# Patient Record
Sex: Female | Born: 1970 | Race: White | Hispanic: No | Marital: Married | Smoking: Current every day smoker
Health system: Southern US, Community
[De-identification: ages and names within clinical notes are randomized; demographics above are authoritative.]

## PROBLEM LIST (undated history)

## (undated) DIAGNOSIS — F32A Depression, unspecified: Secondary | ICD-10-CM

## (undated) DIAGNOSIS — F329 Major depressive disorder, single episode, unspecified: Secondary | ICD-10-CM

## (undated) DIAGNOSIS — R51 Headache: Secondary | ICD-10-CM

## (undated) DIAGNOSIS — F319 Bipolar disorder, unspecified: Secondary | ICD-10-CM

## (undated) DIAGNOSIS — B029 Zoster without complications: Secondary | ICD-10-CM

## (undated) DIAGNOSIS — M545 Low back pain, unspecified: Secondary | ICD-10-CM

## (undated) DIAGNOSIS — N390 Urinary tract infection, site not specified: Secondary | ICD-10-CM

## (undated) DIAGNOSIS — IMO0002 Reserved for concepts with insufficient information to code with codable children: Secondary | ICD-10-CM

## (undated) HISTORY — PX: TONSILECTOMY, ADENOIDECTOMY, BILATERAL MYRINGOTOMY AND TUBES: SHX2538

## (undated) HISTORY — PX: FEMUR FRACTURE SURGERY: SHX633

## (undated) HISTORY — PX: TONSILLECTOMY: SUR1361

## (undated) HISTORY — PX: FOOT FUSION: SHX956

## (undated) HISTORY — PX: CHOLECYSTECTOMY: SHX55

## (undated) HISTORY — PX: TUBAL LIGATION: SHX77

## (undated) HISTORY — PX: WISDOM TOOTH EXTRACTION: SHX21

---

## 1995-03-05 DIAGNOSIS — IMO0002 Reserved for concepts with insufficient information to code with codable children: Secondary | ICD-10-CM

## 1995-03-05 HISTORY — DX: Reserved for concepts with insufficient information to code with codable children: IMO0002

## 1998-09-08 ENCOUNTER — Other Ambulatory Visit: Admission: RE | Admit: 1998-09-08 | Discharge: 1998-09-08 | Payer: Self-pay | Admitting: Obstetrics and Gynecology

## 1999-11-26 ENCOUNTER — Other Ambulatory Visit: Admission: RE | Admit: 1999-11-26 | Discharge: 1999-11-26 | Payer: Self-pay | Admitting: Gynecology

## 2000-01-21 ENCOUNTER — Encounter (INDEPENDENT_AMBULATORY_CARE_PROVIDER_SITE_OTHER): Payer: Self-pay | Admitting: Specialist

## 2000-01-21 ENCOUNTER — Other Ambulatory Visit: Admission: RE | Admit: 2000-01-21 | Discharge: 2000-01-21 | Payer: Self-pay | Admitting: Obstetrics & Gynecology

## 2002-12-07 ENCOUNTER — Encounter: Payer: Self-pay | Admitting: Family Medicine

## 2002-12-07 ENCOUNTER — Ambulatory Visit (HOSPITAL_COMMUNITY): Admission: RE | Admit: 2002-12-07 | Discharge: 2002-12-07 | Payer: Self-pay | Admitting: Family Medicine

## 2002-12-08 ENCOUNTER — Encounter: Payer: Self-pay | Admitting: Family Medicine

## 2002-12-08 ENCOUNTER — Ambulatory Visit (HOSPITAL_COMMUNITY): Admission: RE | Admit: 2002-12-08 | Discharge: 2002-12-08 | Payer: Self-pay | Admitting: Family Medicine

## 2004-03-26 ENCOUNTER — Observation Stay (HOSPITAL_COMMUNITY): Admission: AD | Admit: 2004-03-26 | Discharge: 2004-03-27 | Payer: Self-pay | Admitting: Obstetrics and Gynecology

## 2004-06-25 ENCOUNTER — Ambulatory Visit (HOSPITAL_COMMUNITY): Admission: AD | Admit: 2004-06-25 | Discharge: 2004-06-25 | Payer: Self-pay | Admitting: Obstetrics & Gynecology

## 2004-06-27 ENCOUNTER — Inpatient Hospital Stay (HOSPITAL_COMMUNITY): Admission: RE | Admit: 2004-06-27 | Discharge: 2004-06-29 | Payer: Self-pay | Admitting: Obstetrics & Gynecology

## 2005-01-03 ENCOUNTER — Observation Stay (HOSPITAL_COMMUNITY): Admission: RE | Admit: 2005-01-03 | Discharge: 2005-01-04 | Payer: Self-pay | Admitting: Obstetrics and Gynecology

## 2005-01-03 ENCOUNTER — Encounter: Payer: Self-pay | Admitting: Obstetrics and Gynecology

## 2005-01-21 ENCOUNTER — Ambulatory Visit: Payer: Self-pay | Admitting: Pain Medicine

## 2005-01-30 ENCOUNTER — Ambulatory Visit: Payer: Self-pay | Admitting: Pain Medicine

## 2005-02-04 ENCOUNTER — Ambulatory Visit: Payer: Self-pay | Admitting: Pain Medicine

## 2005-02-21 ENCOUNTER — Ambulatory Visit: Payer: Self-pay | Admitting: Pain Medicine

## 2005-03-06 ENCOUNTER — Ambulatory Visit: Payer: Self-pay | Admitting: Physician Assistant

## 2005-06-24 ENCOUNTER — Ambulatory Visit: Payer: Self-pay | Admitting: Physician Assistant

## 2005-07-08 ENCOUNTER — Ambulatory Visit: Payer: Self-pay | Admitting: Physician Assistant

## 2005-07-22 ENCOUNTER — Ambulatory Visit: Payer: Self-pay | Admitting: Physician Assistant

## 2005-08-15 ENCOUNTER — Ambulatory Visit: Payer: Self-pay | Admitting: Pain Medicine

## 2005-10-28 ENCOUNTER — Ambulatory Visit: Payer: Self-pay | Admitting: Pain Medicine

## 2006-02-20 ENCOUNTER — Ambulatory Visit: Payer: Self-pay | Admitting: Physician Assistant

## 2006-05-30 ENCOUNTER — Ambulatory Visit: Payer: Self-pay | Admitting: Physician Assistant

## 2006-06-05 ENCOUNTER — Ambulatory Visit: Payer: Self-pay | Admitting: Pain Medicine

## 2006-08-26 ENCOUNTER — Ambulatory Visit: Payer: Self-pay | Admitting: Physician Assistant

## 2006-09-07 ENCOUNTER — Emergency Department (HOSPITAL_COMMUNITY): Admission: EM | Admit: 2006-09-07 | Discharge: 2006-09-07 | Payer: Self-pay | Admitting: Emergency Medicine

## 2006-10-09 ENCOUNTER — Ambulatory Visit (HOSPITAL_COMMUNITY): Admission: RE | Admit: 2006-10-09 | Discharge: 2006-10-09 | Payer: Self-pay | Admitting: Family Medicine

## 2006-10-15 ENCOUNTER — Ambulatory Visit (HOSPITAL_COMMUNITY): Admission: RE | Admit: 2006-10-15 | Discharge: 2006-10-15 | Payer: Self-pay | Admitting: Family Medicine

## 2007-08-14 ENCOUNTER — Emergency Department (HOSPITAL_COMMUNITY): Admission: EM | Admit: 2007-08-14 | Discharge: 2007-08-14 | Payer: Self-pay | Admitting: Emergency Medicine

## 2008-07-05 ENCOUNTER — Other Ambulatory Visit: Admission: RE | Admit: 2008-07-05 | Discharge: 2008-07-05 | Payer: Self-pay | Admitting: Obstetrics and Gynecology

## 2008-07-22 ENCOUNTER — Emergency Department (HOSPITAL_COMMUNITY): Admission: EM | Admit: 2008-07-22 | Discharge: 2008-07-22 | Payer: Self-pay | Admitting: Emergency Medicine

## 2008-11-21 ENCOUNTER — Ambulatory Visit (HOSPITAL_COMMUNITY): Admission: RE | Admit: 2008-11-21 | Discharge: 2008-11-21 | Payer: Self-pay | Admitting: Obstetrics and Gynecology

## 2008-12-19 ENCOUNTER — Ambulatory Visit (HOSPITAL_COMMUNITY): Admission: RE | Admit: 2008-12-19 | Discharge: 2008-12-19 | Payer: Self-pay | Admitting: Obstetrics and Gynecology

## 2009-09-06 ENCOUNTER — Emergency Department (HOSPITAL_COMMUNITY): Admission: EM | Admit: 2009-09-06 | Discharge: 2009-09-06 | Payer: Self-pay | Admitting: Emergency Medicine

## 2010-05-20 LAB — BASIC METABOLIC PANEL
BUN: 7 mg/dL (ref 6–23)
CO2: 25 mEq/L (ref 19–32)
CO2: 27 mEq/L (ref 19–32)
Calcium: 9.2 mg/dL (ref 8.4–10.5)
Calcium: 9.6 mg/dL (ref 8.4–10.5)
Creatinine, Ser: 0.9 mg/dL (ref 0.4–1.2)
Creatinine, Ser: 0.94 mg/dL (ref 0.4–1.2)
GFR calc Af Amer: >60 mL/min (ref >60)
GFR calc non Af Amer: >60 mL/min (ref >60)
GFR calc non Af Amer: >60 mL/min (ref >60)
Glucose, Bld: 83 mg/dL (ref 70–99)
Glucose, Bld: 89 mg/dL (ref 70–99)

## 2010-05-20 LAB — URINE CULTURE
Colony Count: NO GROWTH
Culture: NO GROWTH

## 2010-05-20 LAB — DIFFERENTIAL
Basophils Absolute: 0 10*3/uL (ref 0.0–0.1)
Basophils Relative: 0 % (ref 0–1)
Eosinophils Absolute: 0.1 10*3/uL (ref 0.0–0.7)
Monocytes Relative: 4 % (ref 3–12)
Neutrophils Relative %: 76 % (ref 43–77)

## 2010-05-20 LAB — RAPID URINE DRUG SCREEN, HOSP PERFORMED: Barbiturates: NOT DETECTED

## 2010-05-20 LAB — CBC
Hemoglobin: 17.4 g/dL — ABNORMAL HIGH (ref 12.0–15.0)
MCH: 33.4 pg (ref 26.0–34.0)
MCHC: 34.4 g/dL (ref 30.0–36.0)
RDW: 14.2 % (ref 11.5–15.5)

## 2010-05-20 LAB — URINALYSIS, ROUTINE W REFLEX MICROSCOPIC
Glucose, UA: NEGATIVE mg/dL
Hgb urine dipstick: NEGATIVE
Protein, ur: NEGATIVE mg/dL
pH: 7.5 (ref 5.0–8.0)

## 2010-05-20 LAB — URINE MICROSCOPIC-ADD ON

## 2010-05-20 LAB — PREGNANCY, URINE: Preg Test, Ur: NEGATIVE

## 2010-06-07 LAB — HEMOGLOBIN AND HEMATOCRIT, BLOOD: Hemoglobin: 15.6 g/dL — ABNORMAL HIGH (ref 12.0–15.0)

## 2010-07-20 NOTE — Op Note (Signed)
NAMEOLIA, HINDERLITER               ACCOUNT NO.:  [id number]   MEDICAL RECORD NO.:  [id number]          PATIENT TYPE:  OBV   LOCATION:  A336                          FACILITY:  [facility]   PHYSICIAN:  Tilda Burrow, M.D. DATE OF BIRTH:  1970/04/22   DATE OF PROCEDURE:  01/03/2005  DATE OF DISCHARGE:                                 OPERATIVE REPORT   PREOPERATIVE DIAGNOSIS:  Abdominal wall pain secondary to fibrosis from old  wound infection.   POSTOPERATIVE DIAGNOSIS:  Abdominal wall pain secondary to fibrosis from old  wound infection.   PROCEDURE:  Revision of old abdominal wall wound (Pfannenstiel incision).   SURGEON:  Dr. Emelda Fear.   ASSISTANT:  None.   ANESTHESIA:  General.   COMPLICATIONS:  None.   ESTIMATED BLOOD LOSS:  25 cc.   SPECIMENS:  Skin to lab.   DETAILS OF PROCEDURE:  The patient was taken to the operating room and  prepped and draped for low abdominal surgery. A Pfannenstiel type incision  had been marked out with plans to excise the old fibrotic Pfannenstiel  incision with adjacent surrounding tissue. Ellipse of skin to be removed was  approximately 10 cm in width and would reach from anterior to superior iliac  crest on each side to the contralateral side at the same location. The  ellipse of skin was marked first with Bovie cautery through the skin and  then dissected down. Inferior margins were dissected loose first. We were  careful to remove the thick, fibrotic, retracted center portion of the old  Pfannenstiel incision which was densely adherent down to the fascia. This  was sharply excised off of the underlying connective tissue. The upper  margin was then cut and then the skin and underlying connective tissue  excised. We were careful to leave a layer of fatty tissue overlying the  fascia. The skin specimen was passed off. At this point, as previously  discussed with [facility] preoperatively, opened the old Pfannenstiel incision  in the fascia in a  transverse fashion over the middle two thirds of the  incision, undermining the skin cephalad and caudal. We excised an  approximately 2 cm strip of the fascia which included the most fibrotic area  of the prior incision and the part that had been attached to the overlying  tissue. The muscle underneath it was mobilized free from the fascia such  that the fascia could then be pulled together with continuous running 0  Vicryl with good approximation. There was a potential hernia defect  encountered just lateral to the rectus muscles on the right side where a  window developed in the internal oblique tissue. This was pulled together  with a series of interrupted 2-0 plain suture.   Once the fascia had been pulled together, we wanted to mobilize the skin and  subcutaneous tissue to give optimal cosmetic result. The subcutaneous tissue  was split approximately 2 cm beneath the skin, for a depth of approximately  2 cm to allow for increased mobility of the skin and fatty tissue. A series  of interrupted 2-0 plain sutures were then  placed throughout the  subcutaneous tissue to reapproximate the connective tissue and pull the skin  edges closer together without tension. Approximately 15 separate interrupted  2-0 plain stitches were used to pull tissue edges together. Electrocautery  was used as necessary to contain areas of oozing. The procedure was actually  quite dry; 25 mL estimated blood loss. At this time, skin edges could be  pulled together without significant tension. The head was elevated slightly  to make this easier, and then staple closure of the skin performed. Prior to  the stapling, we placed a flat JP drain beneath the skin, and after placing  the staples, we used Marcaine solution to inject just lateral to the  incision on either side to improve pain control postoperatively; 10 mL of  Marcaine solution was infiltrated in the subcu tissue.      Tilda Burrow, M.D.   Electronically Signed     JVF/MEDQ  D:  01/03/2005  T:  01/04/2005  Job:  [id number]   cc:   [facility]

## 2010-07-20 NOTE — Op Note (Signed)
NAME:  [name], [name] NO.:  [id number]   MEDICAL RECORD NO.:  [id number]          PATIENT TYPE:  INP   LOCATION:  A411                          FACILITY:  [facility]   PHYSICIAN:  Lazaro Arms, M.D.   DATE OF BIRTH:  April 20, 1970   DATE OF PROCEDURE:  06/27/2004  DATE OF DISCHARGE:                                 OPERATIVE REPORT   PREOPERATIVE DIAGNOSES:  1.  Intrauterine pregnancy at 40 weeks' gestation.  2.  Previous cesarean section.  3.  Desired sterilization.   POSTOPERATIVE DIAGNOSES:  1.  Intrauterine pregnancy at 40 weeks' gestation.  2.  Previous cesarean section.  3.  Desired sterilization.  4.  Back-up transverse lie.   PROCEDURE:  Repeat cesarean section with bilateral tubal ligation.   SURGEON:  Lazaro Arms, M.D.   ANESTHESIA:  Spinal.   FINDINGS:  Over a low transverse hysterotomy incision was delivered a viable  female infant with Apgars of 8 and 9, the weight to be determined in the  nursery.  The placenta was anterior.  It had to be delivered before the  baby.  The baby was a back-up transverse lie.  We used a vacuum extractor on  the fetal vertex to effect delivery.  The uterus, tubes, and ovaries were  normal.  The placenta was normal as well.  Cord blood and cord gas were  sent.   DESCRIPTION OF OPERATION:  The patient was taken to the operating room and  placed in the sitting position and underwent a spinal anesthetic, placed in  the supine position with a bump under the right hip.  She was prepped and  draped in the usual sterile fashion.  A Pfannenstiel skin incision was made  and carried down sharply to the rectus fascia, which was scored in the  midline and extended laterally.  The fascia was taken off the muscles  superiorly and inferiorly without difficulty.  The muscles were divided and  the peritoneal cavity was entered.  A bladder blade was placed and the  vesicouterine serosal flap was created.  A low transverse hysterotomy  incision was made.  Over this incision was delivered a viable female infant  with Apgars of 8 and 9 with a weight to be determined in the nursery.  There  was a three-vessel cord.  Cord blood and cord gas were sent.  The placenta  was sent to pathology since it was anterior and delivered before the baby.  The infant was delivered with vacuum extraction of the fetal vertex.  It was  back-up transverse lie.  The infant was handed to Dr. Milford Cage, who was in  attendance, for routine neonatal resuscitation.  The uterus was  exteriorized, closed in two layers, the first being a running interlocking  layer, the second being an imbricating layer.  There was good hemostasis.  The peritoneal cavity was irrigated vigorously.  A modified Pomeroy  bilateral tubal ligation was performed bilaterally at patient request.  There was good hemostasis of both pedicles.  The muscles were reapproximated  loosely, the fascia closed using 0  Vicryl running, the subcutaneous tissue  was made hemostatic and irrigated.  The skin was closed using skin staples.  The patient tolerated the procedure  well.  She experienced 750 mL of blood loss and was taken to recovery in  good, stable condition.  All counts were correct x3.      LHE/MEDQ  D:  06/27/2004  T:  06/27/2004  Job:  [id number]

## 2010-07-20 NOTE — Discharge Summary (Signed)
[name], [name]               ACCOUNT NO.:  [id number]   MEDICAL RECORD NO.:  [id number]          PATIENT TYPE:  INP   LOCATION:  A411                          FACILITY:  [facility]   PHYSICIAN:  Lazaro Arms, M.D.   DATE OF BIRTH:  Mar 16, 1970   DATE OF ADMISSION:  06/27/2004  DATE OF DISCHARGE:  04/28/2006LH                                 DISCHARGE SUMMARY   DISCHARGE DIAGNOSES:  1.  Status post a repeat cesarean section with bilateral tubal ligation.  2.  Unremarkable postoperative course.   Please refer to the history and physical as well as the antepartum chart for  details of admission to the hospital.   HOSPITAL COURSE:  Patient was admitted after undergoing a repeat cesarean  section and also bilateral tubal ligation at her request.  The patient's  intraoperative course was unremarkable.  Please refer to the operative note  for details.  Postoperatively the patient did very well.  Her preoperative  hemoglobin and hematocrit were 12.2 and 34.3 and postoperative day #1 was  10.6, 29.6.  Her white count preoperative was 11.4 and postoperative 13.9.  She remained afebrile throughout the hospital course.  Her dressing was  removed.  Her incision was clean, dry, intact.  There was no cellulitis or  evidence of hematoma.  As a result she was discharged to home on the morning  of postoperative day #2 in good stable condition to follow up in the office  the following week to have her staples removed and Steri-Strips placed.  She  was given instructions and precautions for return prior to that time.      LHE/MEDQ  D:  07/25/2004  T:  07/25/2004  Job:  [id number]

## 2010-07-20 NOTE — H&P (Signed)
[name], [name]               ACCOUNT NO.:  [id number]   MEDICAL RECORD NO.:  [id number]          PATIENT TYPE:  OBV   LOCATION:  A416                          FACILITY:  [facility]   PHYSICIAN:  Tilda Burrow, M.D. DATE OF BIRTH:  06-Feb-1971   DATE OF ADMISSION:  03/26/2004  DATE OF DISCHARGE:  LH                                HISTORY & PHYSICAL   HISTORY OF PRESENT ILLNESS:  [name] is a gravida 7, para 3 at 26 weeks and 5  days with unresolved fever and cough after five days of treatment with  Augmentin 875 b.i.d. and cough medication.  She presents to the office today  with decreased lung sounds of her right lobe and bronchial wheezing.   PAST MEDICAL HISTORY:  Negative.   PAST SURGICAL HISTORY:  Positive for a D&C and a C-section.   ALLERGIES:  She has no known allergies.   Her prenatal course has been uneventful up until this time.  Blood type is  O+.  UDS is negative.  Rubella is immune.  Hepatitis B surface antigen is  negative.  HIV is negative. rpr  is nonreactive.  Pap is normal.  GC and  Chlamydia are negative.  AFB is within normal limits.   PHYSICAL EXAMINATION:  Vital signs:  Weight is 147.  Blood pressure 130/70.  Fetal heart rates are 160, strong and regular.  Lungs:  She has decreased  upper right lobe airway sounds and some bronchial wheezing.   PLAN:  We are going to admit for IV antibiotics, shielded chest x-ray, and  respiratory treatments and expect discharge in a.m.      DL/MEDQ  D:  16/12/9602  T:  03/26/2004  Job:  [id number]

## 2010-07-20 NOTE — H&P (Signed)
Pamela, [name]               ACCOUNT NO.:  [id number]   MEDICAL RECORD NO.:  [id number]          PATIENT TYPE:  AMB   LOCATION:  DAY                           FACILITY:  [facility]   PHYSICIAN:  Tilda Burrow, M.D. DATE OF BIRTH:  1970/11/16   DATE OF ADMISSION:  01/03/2005  DATE OF DISCHARGE:  LH                                HISTORY & PHYSICAL   ADMISSION DIAGNOSES:  1.  Chronic incisional pain.  2.  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  3.  Status post cesarean section.   HISTORY OF PRESENT ILLNESS:  This 40 year old female is admitted for  abdominal wall incision revision.  [name] is now 6 months status post a  repeat cesarean section and tubal ligation performed June 29, 2004, which  was complicated by postoperative wound infection which required delayed  secondary healing.  Unfortunately, it has resulted in debilitating pain in  the incision area.  This appears to be very superficial in nature.  She  describes it as a constant tingling and pain across the incision like pins  and needles sticking her.  She avoids clothing that contacts the area.  She  has difficulty wearing a seatbelt and must restrict activities with her  children to avoid contact with the incision.  She cannot sleep on her  stomach and sitting in place puts pressure on the incision.  Due to the  these multiple complaints which have remained stable over the past several  months, we are planning to excise the old cicatrix down to the level of the  fascia and explore the fascia itself to attempt to release the fibrosis and  to sever any entrapped sensory nerves in the fascia.  The patient  understands that in order to address the possibility of an entrapped nerve  in the fascia that we will need to actually open the fascia and excise some  of the old fibrosis in that area as well.  This will prolong healing.  The  patient is aware that there is some potential for recurrent wound infection  with an incision on the body.  She acknowledges this risk.   PAST MEDICAL HISTORY:  Benign.  The patient specifically has no history of  chronic pain syndrome and seems to be a reasonable, level-headed individual  with no overlying psychologic limitations.  Medical history otherwise  negative.   PAST SURGICAL HISTORY:  1.  D&C x2.  2.  C-section x2.  3.  Tonsillectomy and tubes in the ears as a child.   SOCIAL HISTORY:  Smokes 20 cigarettes per day.  Alcohol and recreational  drugs denied.   MEDICATIONS:  Aleve, Tylenol PM, vitamins and Lorcet plus 7.5/650 every 6  hours as needed for pain at the incision.   PHYSICAL EXAMINATION:  GENERAL:  A slim, Caucasian female.  VITAL SIGN:  Height 5 feet 4 inches (estimate), weight 129, blood pressure  118/62.  HEENT:  Pupils equal round and reactive to light.  Extraocular movements  intact.  NECK:  Supple.  CARDIAC:  Unremarkable.  ABDOMEN:  Lax lower abdominal skin except for  dense fibrosis in the area of  the old Pfannenstiel incision.  The skin is very sensitive to even light  touch in the area around the incision with hypersensitive paresthesia.  There is no fluid fluctuation.  There is no suspicious of infection at this  time.   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   PLAN:  Wide excision of old cicatrix with opening of the fascia.  Nicoderm  patch while in hospital.  Overnight observation, end in a.m.      Tilda Burrow, M.D.  Electronically Signed     JVF/MEDQ  D:  01/03/2005  T:  01/03/2005  Job:  [id number]

## 2010-07-20 NOTE — Op Note (Signed)
[name], [name]               ACCOUNT NO.:  [id number]   MEDICAL RECORD NO.:  [id number]          PATIENT TYPE:  OBV   LOCATION:  A336                          FACILITY:  [facility]   PHYSICIAN:  Tilda Burrow, M.D. DATE OF BIRTH:  Mar 17, 1970   DATE OF PROCEDURE:  01/03/2005  DATE OF DISCHARGE:                                 OPERATIVE REPORT   PREOPERATIVE DIAGNOSIS:  Postoperative incisional bleeding.   POSTOPERATIVE DIAGNOSIS:  Postoperative incisional bleeding, superficial.   PROCEDURE:  Exploration of incision.   SURGEON:  Tilda Burrow, M.D.   ASSISTANTAmie Critchley.   ANESTHESIA:  General.   COMPLICATIONS:  None.   ESTIMATED BLOOD LOSS:  50 cc intraoperative.   FINDINGS:  Capillary bleeder in the skin on the left half of the incision.  Ecchymosis in the skin from the old Marcaine injections done at the first  surgery leading to increased suspicion of subcu bleeding.   DETAILS OF PROCEDURE:  The patient was taken to the operating room and  prepped and draped after hemoglobin was found to have dropped from 14 to 12.  The patient continued to actively ooze from a spot in the left of the  midline, one third of the distance from the left end of the incision. This  was bright red bleeding and active and steady. The ecchymosis raised the  question of whether it was a deep capillary bleeder or skin bleeder. Since  it failed to respond to pressure dressing, we proceeded with prepping and  draping, removed the staples for approximately 10 cm around the area of  suspicion. We removed the JP drain earlier prior to prepping. The small  capillary could be identified in the subcu tissue just beneath the skin. The  skin edges had inverted slightly since the surgery, and there was less  pressure on the capillary then likely to have been there earlier. Point  electrocautery was used to achieve hemostasis. The deep parts of the  incision were explored and found to be hemostatic.  There was no dark blood  or clot. The 10-cm portion of the incision was explored sufficiently to  convince me that the fascia was intact, that there was no major bleeding  deep to the incision and the blood was only at the superficial site. I did  find one small clot close to the midline representative of a small capillary  that had bleed some but had stopped. Point electrocautery was used at this  area as well. We replaced the JP drain and allowed it to exit through a  separate stab wound at the left anterior aspect of the mons pubis. Staple  closure of the skin completed the procedure after subcutaneous 2-0 plain  was used to obliterate potential space. The patient tolerated the procedure  well and went to the recovery room in good condition. Intraoperative blood  including _incision exploration and irrigation__ is estimated at 50 mL. As  stated earlier, hemoglobin had dropped from 14 to 12.      Tilda Burrow, M.D.  Electronically Signed     JVF/MEDQ  D:  01/03/2005  T:  01/04/2005  Job:  [id number]

## 2010-08-22 ENCOUNTER — Emergency Department (HOSPITAL_COMMUNITY): Payer: Medicaid Other

## 2010-08-22 ENCOUNTER — Inpatient Hospital Stay (HOSPITAL_COMMUNITY)
Admission: EM | Admit: 2010-08-22 | Discharge: 2010-08-28 | DRG: 956 | Disposition: A | Discharge status: Home Health | Payer: Medicaid Other | Source: Ambulatory Visit | Attending: Surgery | Admitting: Surgery

## 2010-08-22 DIAGNOSIS — D62 Acute posthemorrhagic anemia: Secondary | ICD-10-CM | POA: Diagnosis not present

## 2010-08-22 DIAGNOSIS — F319 Bipolar disorder, unspecified: Secondary | ICD-10-CM | POA: Diagnosis present

## 2010-08-22 DIAGNOSIS — S72309A Unspecified fracture of shaft of unspecified femur, initial encounter for closed fracture: Principal | ICD-10-CM | POA: Diagnosis present

## 2010-08-22 DIAGNOSIS — F17200 Nicotine dependence, unspecified, uncomplicated: Secondary | ICD-10-CM | POA: Diagnosis present

## 2010-08-22 DIAGNOSIS — F1010 Alcohol abuse, uncomplicated: Secondary | ICD-10-CM | POA: Diagnosis present

## 2010-08-22 DIAGNOSIS — S3600XA Unspecified injury of spleen, initial encounter: Secondary | ICD-10-CM | POA: Diagnosis present

## 2010-08-22 DIAGNOSIS — S92309A Fracture of unspecified metatarsal bone(s), unspecified foot, initial encounter for closed fracture: Secondary | ICD-10-CM | POA: Diagnosis present

## 2010-08-22 LAB — COMPREHENSIVE METABOLIC PANEL
AST: 53 U/L — ABNORMAL HIGH (ref 0–37)
Alkaline Phosphatase: 88 U/L (ref 39–117)
BUN: 11 mg/dL (ref 6–23)
CO2: 21 mEq/L (ref 19–32)
Chloride: 102 mEq/L (ref 96–112)
Creatinine, Ser: 0.76 mg/dL (ref 0.50–1.10)
GFR calc non Af Amer: >60 mL/min (ref >60)
Total Bilirubin: 0.2 mg/dL — ABNORMAL LOW (ref 0.3–1.2)

## 2010-08-22 LAB — URINE MICROSCOPIC-ADD ON

## 2010-08-22 LAB — CBC
HCT: 36.4 % (ref 36.0–46.0)
HCT: 39.3 % (ref 36.0–46.0)
Hemoglobin: 12.4 g/dL (ref 12.0–15.0)
MCHC: 34.1 g/dL (ref 30.0–36.0)
MCHC: 35.9 g/dL (ref 30.0–36.0)
MCV: 88.3 fL (ref 78.0–100.0)
RBC: 4.11 MIL/uL (ref 3.87–5.11)
RDW: 12.5 % (ref 11.5–15.5)

## 2010-08-22 LAB — HEMOGLOBIN AND HEMATOCRIT, BLOOD
HCT: 33.5 % — ABNORMAL LOW (ref 36.0–46.0)
HCT: 32.8 % — ABNORMAL LOW (ref 36.0–46.0)
Hemoglobin: 11.6 g/dL — ABNORMAL LOW (ref 12.0–15.0)
Hemoglobin: 11.5 g/dL — ABNORMAL LOW (ref 12.0–15.0)

## 2010-08-22 LAB — BASIC METABOLIC PANEL
BUN: 8 mg/dL (ref 6–23)
CO2: 22 mEq/L (ref 19–32)
Chloride: 107 mEq/L (ref 96–112)
Glucose, Bld: 153 mg/dL — ABNORMAL HIGH (ref 70–99)
Potassium: 3.8 mEq/L (ref 3.5–5.1)
Sodium: 139 mEq/L (ref 135–145)

## 2010-08-22 LAB — POCT I-STAT, CHEM 8
BUN: 10 mg/dL (ref 6–23)
Calcium, Ion: 1.04 mmol/L — ABNORMAL LOW (ref 1.12–1.32)
Chloride: 108 mEq/L (ref 96–112)
Glucose, Bld: 91 mg/dL (ref 70–99)

## 2010-08-22 LAB — URINALYSIS, ROUTINE W REFLEX MICROSCOPIC
Bilirubin Urine: NEGATIVE
Glucose, UA: NEGATIVE mg/dL
Ketones, ur: NEGATIVE mg/dL
Leukocytes, UA: NEGATIVE
Protein, ur: 30 mg/dL — AB

## 2010-08-22 LAB — ETHANOL: Alcohol, Ethyl (B): 131 mg/dL — ABNORMAL HIGH (ref 0–11)

## 2010-08-22 MED ORDER — IOHEXOL 300 MG/ML  SOLN
100.0000 mL | Freq: Once | INTRAMUSCULAR | Status: AC | PRN
  Administered 2010-08-22: 100 mL via INTRAVENOUS

## 2010-08-23 LAB — URINE CULTURE

## 2010-08-23 LAB — COMPREHENSIVE METABOLIC PANEL
ALT: 172 U/L — ABNORMAL HIGH (ref 0–35)
AST: 117 U/L — ABNORMAL HIGH (ref 0–37)
Albumin: 3 g/dL — ABNORMAL LOW (ref 3.5–5.2)
CO2: 28 mEq/L (ref 19–32)
Calcium: 8.5 mg/dL (ref 8.4–10.5)
Chloride: 103 mEq/L (ref 96–112)
Creatinine, Ser: 0.65 mg/dL (ref 0.50–1.10)
GFR calc non Af Amer: >60 mL/min (ref >60)
Sodium: 138 mEq/L (ref 135–145)
Total Bilirubin: 0.6 mg/dL (ref 0.3–1.2)

## 2010-08-23 LAB — CBC
HCT: 30.7 % — ABNORMAL LOW (ref 36.0–46.0)
Hemoglobin: 10.6 g/dL — ABNORMAL LOW (ref 12.0–15.0)
WBC: 9.9 10*3/uL (ref 4.0–10.5)

## 2010-08-23 LAB — HEMOGLOBIN AND HEMATOCRIT, BLOOD
HCT: 31.3 % — ABNORMAL LOW (ref 36.0–46.0)
HCT: 30.1 % — ABNORMAL LOW (ref 36.0–46.0)
Hemoglobin: 10.8 g/dL — ABNORMAL LOW (ref 12.0–15.0)

## 2010-08-23 LAB — PROTIME-INR
INR: 1.02 (ref 0.00–1.49)
Prothrombin Time: 13.6 seconds (ref 11.6–15.2)

## 2010-08-24 LAB — BASIC METABOLIC PANEL
BUN: 4 mg/dL — ABNORMAL LOW (ref 6–23)
Chloride: 105 mEq/L (ref 96–112)
Creatinine, Ser: 0.58 mg/dL (ref 0.50–1.10)
Glucose, Bld: 105 mg/dL — ABNORMAL HIGH (ref 70–99)
Potassium: 3.7 mEq/L (ref 3.5–5.1)

## 2010-08-24 LAB — CBC
HCT: 28.9 % — ABNORMAL LOW (ref 36.0–46.0)
Hemoglobin: 10.2 g/dL — ABNORMAL LOW (ref 12.0–15.0)
MCV: 90 fL (ref 78.0–100.0)
RDW: 12.6 % (ref 11.5–15.5)
WBC: 12.2 10*3/uL — ABNORMAL HIGH (ref 4.0–10.5)

## 2010-08-25 LAB — CBC
HCT: 27.3 % — ABNORMAL LOW (ref 36.0–46.0)
Platelets: 140 10*3/uL — ABNORMAL LOW (ref 150–400)
RDW: 12.5 % (ref 11.5–15.5)
WBC: 10.8 10*3/uL — ABNORMAL HIGH (ref 4.0–10.5)

## 2010-08-28 LAB — CBC
Hemoglobin: 9.4 g/dL — ABNORMAL LOW (ref 12.0–15.0)
MCH: 30.4 pg (ref 26.0–34.0)
Platelets: 236 10*3/uL (ref 150–400)
RBC: 3.09 MIL/uL — ABNORMAL LOW (ref 3.87–5.11)
WBC: 10.6 10*3/uL — ABNORMAL HIGH (ref 4.0–10.5)

## 2010-08-29 ENCOUNTER — Ambulatory Visit (HOSPITAL_BASED_OUTPATIENT_CLINIC_OR_DEPARTMENT_OTHER)
Admission: RE | Admit: 2010-08-29 | Discharge: 2010-08-30 | Disposition: A | Discharge status: Home / Self Care | Payer: No Typology Code available for payment source | Source: Ambulatory Visit | Attending: Orthopedic Surgery | Admitting: Orthopedic Surgery

## 2010-08-29 DIAGNOSIS — IMO0002 Reserved for concepts with insufficient information to code with codable children: Secondary | ICD-10-CM | POA: Insufficient documentation

## 2010-08-29 DIAGNOSIS — S92209A Fracture of unspecified tarsal bone(s) of unspecified foot, initial encounter for closed fracture: Secondary | ICD-10-CM | POA: Insufficient documentation

## 2010-08-29 DIAGNOSIS — M12579 Traumatic arthropathy, unspecified ankle and foot: Secondary | ICD-10-CM | POA: Insufficient documentation

## 2010-08-29 DIAGNOSIS — S92309A Fracture of unspecified metatarsal bone(s), unspecified foot, initial encounter for closed fracture: Secondary | ICD-10-CM | POA: Insufficient documentation

## 2010-08-29 LAB — POCT HEMOGLOBIN-HEMACUE: Hemoglobin: 10.1 g/dL — ABNORMAL LOW (ref 12.0–15.0)
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
## 2010-11-16 ENCOUNTER — Ambulatory Visit: Payer: Medicaid Other | Attending: Orthopedic Surgery | Admitting: Physical Therapy

## 2010-11-16 DIAGNOSIS — IMO0001 Reserved for inherently not codable concepts without codable children: Secondary | ICD-10-CM | POA: Insufficient documentation

## 2010-11-16 DIAGNOSIS — M25669 Stiffness of unspecified knee, not elsewhere classified: Secondary | ICD-10-CM | POA: Insufficient documentation

## 2010-11-16 DIAGNOSIS — M6281 Muscle weakness (generalized): Secondary | ICD-10-CM | POA: Insufficient documentation

## 2010-11-16 DIAGNOSIS — R262 Difficulty in walking, not elsewhere classified: Secondary | ICD-10-CM | POA: Insufficient documentation

## 2010-11-22 ENCOUNTER — Ambulatory Visit: Payer: Medicaid Other

## 2010-11-28 ENCOUNTER — Encounter: Payer: No Typology Code available for payment source | Admitting: Physical Therapy

## 2010-11-30 ENCOUNTER — Encounter: Payer: No Typology Code available for payment source | Admitting: Physical Therapy

## 2010-12-04 ENCOUNTER — Ambulatory Visit: Payer: Medicaid Other | Attending: Orthopedic Surgery | Admitting: Physical Therapy

## 2010-12-04 DIAGNOSIS — M25669 Stiffness of unspecified knee, not elsewhere classified: Secondary | ICD-10-CM | POA: Insufficient documentation

## 2010-12-04 DIAGNOSIS — M6281 Muscle weakness (generalized): Secondary | ICD-10-CM | POA: Insufficient documentation

## 2010-12-04 DIAGNOSIS — R262 Difficulty in walking, not elsewhere classified: Secondary | ICD-10-CM | POA: Insufficient documentation

## 2010-12-04 DIAGNOSIS — IMO0001 Reserved for inherently not codable concepts without codable children: Secondary | ICD-10-CM | POA: Insufficient documentation

## 2010-12-07 ENCOUNTER — Ambulatory Visit: Payer: Medicaid Other | Admitting: Physical Therapy

## 2010-12-09 ENCOUNTER — Emergency Department (HOSPITAL_COMMUNITY)
Admission: EM | Admit: 2010-12-09 | Discharge: 2010-12-09 | Disposition: A | Discharge status: Home / Self Care | Payer: Medicaid Other | Attending: Emergency Medicine | Admitting: Emergency Medicine

## 2010-12-09 ENCOUNTER — Encounter: Payer: Self-pay | Admitting: *Deleted

## 2010-12-09 DIAGNOSIS — B029 Zoster without complications: Secondary | ICD-10-CM | POA: Insufficient documentation

## 2010-12-09 DIAGNOSIS — H53149 Visual discomfort, unspecified: Secondary | ICD-10-CM | POA: Insufficient documentation

## 2010-12-09 DIAGNOSIS — H5710 Ocular pain, unspecified eye: Secondary | ICD-10-CM | POA: Insufficient documentation

## 2010-12-09 DIAGNOSIS — F17200 Nicotine dependence, unspecified, uncomplicated: Secondary | ICD-10-CM | POA: Insufficient documentation

## 2010-12-09 MED ORDER — HYDROCODONE-ACETAMINOPHEN 5-325 MG PO TABS
1.0000 | ORAL_TABLET | Freq: Once | ORAL | Status: AC
  Administered 2010-12-09: 1 via ORAL
  Filled 2010-12-09: qty 1

## 2010-12-09 MED ORDER — ACYCLOVIR 200 MG PO CAPS
800.0000 mg | ORAL_CAPSULE | Freq: Once | ORAL | Status: AC
  Administered 2010-12-09: 800 mg via ORAL
  Filled 2010-12-09: qty 4

## 2010-12-09 MED ORDER — DOXYCYCLINE HYCLATE 100 MG PO TABS
100.0000 mg | ORAL_TABLET | Freq: Once | ORAL | Status: AC
  Administered 2010-12-09: 100 mg via ORAL
  Filled 2010-12-09: qty 1

## 2010-12-09 MED ORDER — DOXYCYCLINE HYCLATE 100 MG PO CAPS: 100.0000 mg | ORAL_CAPSULE | Freq: Two times a day (BID) | ORAL | Status: AC

## 2010-12-09 MED ORDER — ACYCLOVIR 800 MG PO TABS: 800.0000 mg | ORAL_TABLET | Freq: Every day | ORAL | Status: AC

## 2010-12-09 MED ORDER — ACYCLOVIR 200 MG PO CAPS
ORAL_CAPSULE | ORAL | Status: AC
  Filled 2010-12-09: qty 4

## 2010-12-09 NOTE — ED Notes (Signed)
Pt states that she started to notice "knots" to her right side of forehead and right side of head, above right eye also, pt c/o swelling to right eye, also c/o weakness since the :"\knots" started to appear, denies any injury,

## 2010-12-09 NOTE — ED Provider Notes (Signed)
History     CSN: [id number] Arrival date & time: 12/09/2010  2:28 PM  Chief Complaint  Patient presents with  . Cyst    (Consider location/radiation/quality/duration/timing/severity/associated sxs/prior treatment) HPI Comments: Pt states she has had these skin lesions "cysts" pop up on her face in the past 3-4 days.  No fever or chills.  No trauma.  No h/o being immunocompromised  The history is provided by the patient. No language interpreter was used.    History reviewed. No pertinent past medical history.  Past Surgical History  Procedure Date  . Foot fusion   . Femur fracture surgery   . Cesarean section   . Tubal ligation   . Cholecystectomy   . Tonsillectomy   . Tonsilectomy, adenoidectomy, bilateral myringotomy and tubes     History reviewed. No pertinent family history.  History  Substance Use Topics  . Smoking status: Current Everyday Smoker  . Smokeless tobacco: Not on file  . Alcohol Use:     OB History    Grav Para Term Preterm Abortions TAB SAB Ect Mult Living                  Review of Systems  Eyes: Positive for photophobia, pain and redness.  All other systems reviewed and are negative.    Allergies  Review of patient's allergies indicates no known allergies.  Home Medications  No current outpatient prescriptions on file.  BP 128/75  Pulse 84  Temp(Src) 98.8 F (37.1 C) (Oral)  Resp 20  Ht 5\' 2"  (1.575 m)  Wt 135 lb (61.236 kg)  BMI 24.69 kg/m2  SpO2 99%  Physical Exam  Nursing note and vitals reviewed. Constitutional: She is oriented to person, place, and time. Vital signs are normal. She appears well-developed and well-nourished. No distress.  HENT:  Head: Normocephalic and atraumatic.    Right Ear: External ear normal.  Left Ear: External ear normal.  Nose: Nose normal.  Mouth/Throat: No oropharyngeal exudate.  Eyes: Conjunctivae and EOM are normal. Pupils are equal, round, and reactive to light. Right eye exhibits no  discharge. Left eye exhibits no discharge. No scleral icterus.  Neck: Normal range of motion. Neck supple. No JVD present. No tracheal deviation present. No thyromegaly present.  Cardiovascular: Normal rate, regular rhythm, normal heart sounds, intact distal pulses and normal pulses.  Exam reveals no gallop and no friction rub.   No murmur heard. Pulmonary/Chest: Effort normal and breath sounds normal. No stridor. No respiratory distress. She has no wheezes. She has no rales. She exhibits no tenderness.  Abdominal: Soft. Normal appearance and bowel sounds are normal. She exhibits no distension and no mass. There is no tenderness. There is no rebound and no guarding.  Musculoskeletal: Normal range of motion. She exhibits no edema and no tenderness.  Lymphadenopathy:    She has no cervical adenopathy.  Neurological: She is alert and oriented to person, place, and time. She has normal reflexes. Coordination normal. GCS eye subscore is 4. GCS verbal subscore is 5. GCS motor subscore is 6.  Skin: Skin is warm and dry. No rash noted. She is not diaphoretic.  Psychiatric: She has a normal mood and affect. Her speech is normal and behavior is normal. Judgment and thought content normal. Cognition and memory are normal.    ED Course  Procedures (including critical care time)  Labs Reviewed - No data to display No results found.   No diagnosis found.    MDM  4:08 PM i spoke  with dr. Sudie Bailey.  He will see pt in his office tomorrow or the following day.  4:00 PM- i spoke with dr. zammit who then came and evaluated the facial lesions.  We decided to treat for both bacterial and shingles lesions.      Worthy Rancher, PA 12/09/10 1612  Worthy Rancher, PA 12/09/10 [phone]

## 2010-12-09 NOTE — ED Notes (Signed)
Pt has ?abrasion to top of forehead and red area in between her eyes on her nose, pt denies any injury,

## 2010-12-09 NOTE — ED Provider Notes (Signed)
Medical screening examination/treatment/procedure(s) were conducted as a shared visit with non-physician practitioner(s) and myself.  I personally evaluated the patient during the encounter Pt complained of a rash.  pe  Rash to forehead and right side of nose.  Benny Lennert, MD 12/09/10 2237

## 2010-12-11 ENCOUNTER — Encounter: Payer: No Typology Code available for payment source | Admitting: Physical Therapy

## 2010-12-12 ENCOUNTER — Encounter: Payer: No Typology Code available for payment source | Admitting: Physical Therapy

## 2010-12-18 ENCOUNTER — Encounter: Payer: No Typology Code available for payment source | Admitting: Physical Therapy

## 2010-12-20 ENCOUNTER — Encounter: Payer: No Typology Code available for payment source | Admitting: Physical Therapy

## 2011-01-07 ENCOUNTER — Other Ambulatory Visit: Payer: Self-pay | Admitting: Orthopedic Surgery

## 2011-01-07 ENCOUNTER — Encounter (HOSPITAL_COMMUNITY): Payer: Self-pay

## 2011-01-07 DIAGNOSIS — IMO0002 Reserved for concepts with insufficient information to code with codable children: Secondary | ICD-10-CM

## 2011-01-08 ENCOUNTER — Encounter (HOSPITAL_COMMUNITY): Payer: Self-pay

## 2011-01-08 ENCOUNTER — Other Ambulatory Visit: Payer: Self-pay

## 2011-01-08 ENCOUNTER — Encounter (HOSPITAL_COMMUNITY)
Admission: RE | Admit: 2011-01-08 | Discharge: 2011-01-08 | Disposition: A | Discharge status: Home / Self Care | Payer: Medicaid Other | Source: Ambulatory Visit | Attending: Orthopedic Surgery | Admitting: Orthopedic Surgery

## 2011-01-08 HISTORY — DX: Major depressive disorder, single episode, unspecified: F32.9

## 2011-01-08 HISTORY — DX: Reserved for concepts with insufficient information to code with codable children: IMO0002

## 2011-01-08 HISTORY — DX: Headache: R51

## 2011-01-08 HISTORY — DX: Low back pain: M54.5

## 2011-01-08 HISTORY — DX: Bipolar disorder, unspecified: F31.9

## 2011-01-08 HISTORY — DX: Low back pain, unspecified: M54.50

## 2011-01-08 HISTORY — DX: Zoster without complications: B02.9

## 2011-01-08 HISTORY — DX: Depression, unspecified: F32.A

## 2011-01-08 LAB — COMPREHENSIVE METABOLIC PANEL
Albumin: 4 g/dL (ref 3.5–5.2)
BUN: 7 mg/dL (ref 6–23)
Calcium: 10.4 mg/dL (ref 8.4–10.5)
Creatinine, Ser: 0.7 mg/dL (ref 0.50–1.10)
GFR calc Af Amer: >90 mL/min (ref >90)
Glucose, Bld: 83 mg/dL (ref 70–99)
Total Protein: 6.9 g/dL (ref 6.0–8.3)

## 2011-01-08 LAB — CBC
HCT: 38.3 % (ref 36.0–46.0)
Hemoglobin: 13.5 g/dL (ref 12.0–15.0)
MCH: 30.5 pg (ref 26.0–34.0)
MCHC: 35.2 g/dL (ref 30.0–36.0)
MCV: 86.5 fL (ref 78.0–100.0)
RDW: 13.2 % (ref 11.5–15.5)

## 2011-01-08 LAB — URINALYSIS, ROUTINE W REFLEX MICROSCOPIC
Bilirubin Urine: NEGATIVE
Hgb urine dipstick: NEGATIVE
Ketones, ur: NEGATIVE mg/dL
Specific Gravity, Urine: 1.007 (ref 1.005–1.030)
pH: 6 (ref 5.0–8.0)

## 2011-01-08 LAB — DIFFERENTIAL
Eosinophils Absolute: 0.3 10*3/uL (ref 0.0–0.7)
Lymphs Abs: 4 10*3/uL (ref 0.7–4.0)
Monocytes Relative: 3 % (ref 3–12)
Neutro Abs: 8.9 10*3/uL — ABNORMAL HIGH (ref 1.7–7.7)
Neutrophils Relative %: 65 % (ref 43–77)

## 2011-01-08 LAB — PROTIME-INR
INR: 0.92 (ref 0.00–1.49)
Prothrombin Time: 12.6 seconds (ref 11.6–15.2)

## 2011-01-08 LAB — SURGICAL PCR SCREEN: Staphylococcus aureus: NEGATIVE

## 2011-01-08 LAB — URINE MICROSCOPIC-ADD ON

## 2011-01-08 LAB — APTT: aPTT: 33 seconds (ref 24–37)

## 2011-01-08 NOTE — Pre-Procedure Instructions (Signed)
[name]  01/08/2011   Your procedure is scheduled on:  Friday January 11, 2011  Report to [facility] at 530 AM.  Call this number if you have problems the morning of surgery: [phone]   Remember:   Do not eat food:After Midnight.  Do not drink clear liquids: 4 Hours before arrival (1:30am).  Take these medicines the morning of surgery with A SIP OF WATER:  Hydrocodone, lamotrigine(lamictal)   Do not wear jewelry, make-up or nail polish.  Do not wear lotions, powders, or perfumes. You may wear deodorant.  Do not shave 48 hours prior to surgery.  Do not bring valuables to the hospital.  Contacts, dentures or bridgework may not be worn into surgery.  Leave suitcase in the car. After surgery it may be brought to your room.  For patients admitted to the hospital, checkout time is 11:00 AM the day of discharge.   Patients discharged the day of surgery will not be allowed to drive home.  Name and phone number of your driver: Renold Genta [phone]  Special Instructions: CHG Shower Use Special Wash: 1/2 bottle night before surgery and 1/2 bottle morning of surgery.   Please read over the following fact sheets that you were given: Pain Booklet, Coughing and Deep Breathing, MRSA Information and Surgical Site Infection Prevention

## 2011-01-08 NOTE — Progress Notes (Signed)
Contacted Dr. Magdalene Patricia office, spoke with [facility] requested orders for PAT/surgery, states Dr. Magdalene Patricia PA, Mellody Dance would be entering orders.

## 2011-01-09 LAB — URINE CULTURE
Culture  Setup Time: 201211062101
Culture: NO GROWTH

## 2011-01-09 NOTE — H&P (Addendum)
[name] is an 40 y.o. female.   Chief Complaint: R thigh pain/R femur nonunion HPI: Ms. [name] is a 40 y/o female with h/o R femur fracture and R foot fxs in June of 2012. She was initially treated by Dr. Lestine Box with IMN of her R femur.  Since surgery she has been having issues with respect to pain and has continued to smoke despite continued instructions to cease.  Pt followed up with OTS regarding a R femoral shaft nonunion.  Pt now presents to [facility] for [facility] and exchange nailing of R femoral shaft nonunion Pt reports persistent R thigh pain and difficulty ambulating.  Pt did have acute fracture of HW in the beginning of October as well.  We did inform patient at that time that we would not operate until she could prove she could quit smoking.  We had this conversation 2 weeks ago and states she has stopped smoking.  We will obtain nicotine test to verify.  Again it was explained at length that proceeding with surgery and continued smoking would put the patient at unnecessary risk.  We wanted to optimize her healing potential before proceeding with surgery and patient was in complete agreement   Past Medical History  Diagnosis Date  . Ulcer 1997    treated and resolved  . Headache     occassional migraines  . Low back pain   . Depression   . Bipolar 1 disorder, depressed     takes lamictal  . Shingles     has had in pass month, completed treatment week of 01/02/2011, on forehead and near eye    Past Surgical History  Procedure Date  . Foot fusion   . Femur fracture surgery   . Cesarean section     x2  . Tubal ligation   . Cholecystectomy   . Tonsillectomy   . Tonsilectomy, adenoidectomy, bilateral myringotomy and tubes   . Wisdom tooth extraction     No family history on file.  Social History:  reports that she has been smoking Cigarettes.  She has smoked for the past 28 years. She does not have any smokeless tobacco history on file. She reports that she drinks alcohol. She  reports that she uses illicit drugs (Marijuana).  Allergies: No Known Allergies  No current facility-administered medications on file as of .   No current outpatient prescriptions on file as of .    Results for orders placed during the hospital encounter of 01/08/11 (from the past 48 hour(s))  HCG, SERUM, QUALITATIVE     Status: Normal   Collection Time   01/08/11  1:32 PM      Component Value Range Comment   Preg, Serum NEGATIVE  NEGATIVE     Dg Chest 2 View  01/08/2011  *RADIOLOGY REPORT*  Clinical Data: Preoperative respiratory examination for right femur hardware removal.  CHEST - 2 VIEW  Comparison: Chest CT 08/22/2010.  Chest radiographs 08/22/2010 and 03/26/2004.  Findings: The heart size and mediastinal contours are stable.  The lungs appear clear.  There is slightly increased density at the confluence of the right sixth and ninth ribs on the frontal examination.  This is probably due to overlap of osseous and vascular structures or possibly a healing fracture.  There was no evidence of nodule in this area on the CT performed 4 months ago. No acute osseous findings are identified.  IMPRESSION: No acute cardiopulmonary process.  Original Report Authenticated By: Gerrianne Scale, M.D.  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   HENT: Negative.   Eyes: Negative.   Respiratory: Negative.   Cardiovascular: Negative.   Gastrointestinal: Negative.   Genitourinary: Negative.   Musculoskeletal:       + Right thigh pain  Skin: Negative.   Neurological: Negative.   Psychiatric/Behavioral: Positive for depression.    There were no vitals taken for this visit. Office V/S: Afebrile with stable VS  Physical Exam  Constitutional: She is oriented to person, place, and time. Vital signs are normal. She appears well-developed and well-nourished.  HENT:  Head: Normocephalic and atraumatic.    Eyes: EOM are normal.  Neck: Normal range of motion and full passive range of motion without pain. Neck supple. No spinous process tenderness and no muscular tenderness present.  Cardiovascular: Normal rate, regular rhythm, S1 normal, S2 normal and normal heart sounds.   Pulses:      Dorsalis pedis pulses are 2+ on the right side, and 2+ on the left side.  Respiratory: Effort normal and breath sounds normal.  GI: Soft. Bowel sounds are normal. There is no tenderness.  Musculoskeletal:       Right hip: She exhibits decreased strength. She exhibits no tenderness.       Right knee: She exhibits normal range of motion, no effusion, no deformity, normal alignment, no LCL laxity and no bony tenderness. no tenderness found. No medial joint line, no lateral joint line, no MCL and no LCL tenderness noted.       Legs:      R Hip ROM is limited secondary to pain in R thigh Good ROM at R Knee, particularly full extension noted, no effusion and no instability are noted  Clinical alignment of B lower extremities appears appropriate  Neurological: She is alert and oriented to person, place, and time. No sensory deficit.       Decreased strength R hip and knee secondary to pain at R thigh  Skin: Skin is warm, dry and intact.       Surgical wounds are healed without erythema or other signs of infection  Psychiatric: She has a normal mood and affect. Her behavior is normal. Judgment and thought content normal. Cognition and memory are normal.     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 R Foot injury  S/p ORIF by Dr, Lestine Box 3. Depression 4. Nicotine Dependence  Discussed nicotine use at length with patient.  She understands that with continued nicotine use the likelihood  of her going on to complete union are drastically diminished 5. FEN  Will complete metabolic bone workup as an inpatient, but again confident that biology deficiency due to nicotine use 6. Dispo  OR on 01/11/2011 for [facility] and exchange nailing of right femur  Mearl Latin [phone]) 01/09/2011, 9:57 AM I discussed with the patient the risks and benefits of surgery, including the possibility of infection, nerve injury, vessel injury, wound breakdown, arthritis, symptomatic hardware, DVT/ PE, loss of motion, persistent nonunion, and need for further surgery among others. She understood these risks and wished to proceed.

## 2011-01-10 MED ORDER — CEFAZOLIN SODIUM 1-5 GM-% IV SOLN
1.0000 g | INTRAVENOUS | Status: DC
  Filled 2011-01-10: qty 50

## 2011-01-10 NOTE — Progress Notes (Signed)
Labs reviewed today  U/A and micro were reviewed which showed evidence of Trichomoniasis Pt was called in Flagyl 500 mg po q12h x 7 days  Mearl Latin, PA-C 12/10/2010 1733

## 2011-01-11 ENCOUNTER — Inpatient Hospital Stay (HOSPITAL_COMMUNITY): Payer: Medicaid Other

## 2011-01-11 ENCOUNTER — Encounter (HOSPITAL_COMMUNITY)
Admission: RE | Disposition: A | Discharge status: Home / Self Care | Payer: Self-pay | Source: Ambulatory Visit | Attending: Orthopedic Surgery

## 2011-01-11 ENCOUNTER — Inpatient Hospital Stay (HOSPITAL_COMMUNITY)
Admission: RE | Admit: 2011-01-11 | Discharge: 2011-01-14 | DRG: 481 | Disposition: A | Discharge status: Home / Self Care | Payer: Medicaid Other | Source: Ambulatory Visit | Attending: Orthopedic Surgery | Admitting: Orthopedic Surgery

## 2011-01-11 ENCOUNTER — Inpatient Hospital Stay (HOSPITAL_COMMUNITY): Payer: Medicaid Other | Admitting: Vascular Surgery

## 2011-01-11 ENCOUNTER — Other Ambulatory Visit: Payer: Self-pay | Admitting: Orthopedic Surgery

## 2011-01-11 ENCOUNTER — Encounter (HOSPITAL_COMMUNITY): Payer: Self-pay | Admitting: *Deleted

## 2011-01-11 ENCOUNTER — Encounter (HOSPITAL_COMMUNITY): Payer: Self-pay | Admitting: Anesthesiology

## 2011-01-11 DIAGNOSIS — Z8711 Personal history of peptic ulcer disease: Secondary | ICD-10-CM

## 2011-01-11 DIAGNOSIS — F3130 Bipolar disorder, current episode depressed, mild or moderate severity, unspecified: Secondary | ICD-10-CM | POA: Diagnosis present

## 2011-01-11 DIAGNOSIS — G43909 Migraine, unspecified, not intractable, without status migrainosus: Secondary | ICD-10-CM | POA: Diagnosis present

## 2011-01-11 DIAGNOSIS — IMO0002 Reserved for concepts with insufficient information to code with codable children: Principal | ICD-10-CM | POA: Diagnosis present

## 2011-01-11 DIAGNOSIS — Y831 Surgical operation with implant of artificial internal device as the cause of abnormal reaction of the patient, or of later complication, without mention of misadventure at the time of the procedure: Secondary | ICD-10-CM | POA: Diagnosis present

## 2011-01-11 DIAGNOSIS — D62 Acute posthemorrhagic anemia: Secondary | ICD-10-CM | POA: Diagnosis not present

## 2011-01-11 DIAGNOSIS — M545 Low back pain, unspecified: Secondary | ICD-10-CM | POA: Insufficient documentation

## 2011-01-11 DIAGNOSIS — Z01812 Encounter for preprocedural laboratory examination: Secondary | ICD-10-CM

## 2011-01-11 DIAGNOSIS — Z87891 Personal history of nicotine dependence: Secondary | ICD-10-CM

## 2011-01-11 DIAGNOSIS — A5900 Urogenital trichomoniasis, unspecified: Secondary | ICD-10-CM | POA: Diagnosis present

## 2011-01-11 DIAGNOSIS — R519 Headache, unspecified: Secondary | ICD-10-CM | POA: Insufficient documentation

## 2011-01-11 DIAGNOSIS — S7290XK Unspecified fracture of unspecified femur, subsequent encounter for closed fracture with nonunion: Secondary | ICD-10-CM

## 2011-01-11 DIAGNOSIS — R51 Headache: Secondary | ICD-10-CM | POA: Insufficient documentation

## 2011-01-11 DIAGNOSIS — A599 Trichomoniasis, unspecified: Secondary | ICD-10-CM | POA: Diagnosis present

## 2011-01-11 DIAGNOSIS — T84498A Other mechanical complication of other internal orthopedic devices, implants and grafts, initial encounter: Secondary | ICD-10-CM | POA: Diagnosis present

## 2011-01-11 DIAGNOSIS — F329 Major depressive disorder, single episode, unspecified: Secondary | ICD-10-CM | POA: Insufficient documentation

## 2011-01-11 DIAGNOSIS — F319 Bipolar disorder, unspecified: Secondary | ICD-10-CM | POA: Insufficient documentation

## 2011-01-11 DIAGNOSIS — F1210 Cannabis abuse, uncomplicated: Secondary | ICD-10-CM | POA: Diagnosis present

## 2011-01-11 DIAGNOSIS — Z01818 Encounter for other preprocedural examination: Secondary | ICD-10-CM

## 2011-01-11 HISTORY — DX: Urinary tract infection, site not specified: N39.0

## 2011-01-11 HISTORY — PX: FEMUR IM NAIL: SHX1597

## 2011-01-11 LAB — C-REACTIVE PROTEIN: CRP: 0.75 mg/dL — ABNORMAL HIGH (ref <0.60)

## 2011-01-11 SURGERY — INSERTION, INTRAMEDULLARY ROD, FEMUR
Anesthesia: General | Laterality: Right

## 2011-01-11 MED ORDER — NEOSTIGMINE METHYLSULFATE 1 MG/ML IJ SOLN
INTRAMUSCULAR | Status: DC | PRN
  Administered 2011-01-11: 4 mg via INTRAVENOUS

## 2011-01-11 MED ORDER — OXYCODONE HCL 5 MG PO TABS
5.0000 mg | ORAL_TABLET | ORAL | Status: DC | PRN
  Administered 2011-01-12 – 2011-01-14 (×7): 10 mg via ORAL
  Filled 2011-01-11 (×7): qty 2

## 2011-01-11 MED ORDER — VITAMIN D-3 125 MCG (5000 UT) PO TABS: 1.0000 | ORAL_TABLET | Freq: Every day | ORAL | Status: DC

## 2011-01-11 MED ORDER — HYDROMORPHONE HCL PF 1 MG/ML IJ SOLN
0.5000 mg | INTRAMUSCULAR | Status: DC | PRN
  Administered 2011-01-11 (×4): 0.5 mg via INTRAVENOUS

## 2011-01-11 MED ORDER — INFLUENZA VAC TYP A&B SURF ANT IM INJ
0.5000 mL | INJECTION | INTRAMUSCULAR | Status: AC
  Administered 2011-01-12: 0.5 mL via INTRAMUSCULAR
  Filled 2011-01-11: qty 0.5

## 2011-01-11 MED ORDER — POTASSIUM CHLORIDE IN NACL 20-0.9 MEQ/L-% IV SOLN
INTRAVENOUS | Status: DC
  Administered 2011-01-11 – 2011-01-13 (×3): via INTRAVENOUS
  Filled 2011-01-11 (×8): qty 1000

## 2011-01-11 MED ORDER — METHOCARBAMOL 100 MG/ML IJ SOLN: 1000.0000 mg | Freq: Four times a day (QID) | INTRAVENOUS | Status: DC | PRN

## 2011-01-11 MED ORDER — HETASTARCH-ELECTROLYTES 6 % IV SOLN
INTRAVENOUS | Status: DC | PRN
  Administered 2011-01-11 (×2): via INTRAVENOUS

## 2011-01-11 MED ORDER — METHOCARBAMOL 100 MG/ML IJ SOLN
500.0000 mg | Freq: Four times a day (QID) | INTRAVENOUS | Status: DC | PRN
  Filled 2011-01-11: qty 5

## 2011-01-11 MED ORDER — SODIUM CHLORIDE 0.9 % IR SOLN
Status: DC | PRN
  Administered 2011-01-11: 1000 mL

## 2011-01-11 MED ORDER — METRONIDAZOLE 500 MG PO TABS
500.0000 mg | ORAL_TABLET | Freq: Two times a day (BID) | ORAL | Status: DC
  Administered 2011-01-11 – 2011-01-14 (×6): 500 mg via ORAL
  Filled 2011-01-11 (×7): qty 1

## 2011-01-11 MED ORDER — ONDANSETRON HCL 4 MG/2ML IJ SOLN
INTRAMUSCULAR | Status: DC | PRN
  Administered 2011-01-11: 4 mg via INTRAVENOUS

## 2011-01-11 MED ORDER — DIPHENHYDRAMINE HCL 12.5 MG/5ML PO ELIX
12.5000 mg | ORAL_SOLUTION | Freq: Four times a day (QID) | ORAL | Status: DC | PRN
  Filled 2011-01-11: qty 5

## 2011-01-11 MED ORDER — SODIUM CHLORIDE 0.9 % IJ SOLN: 9.0000 mL | INTRAMUSCULAR | Status: DC | PRN

## 2011-01-11 MED ORDER — LACTATED RINGERS IV SOLN: INTRAVENOUS | Status: DC

## 2011-01-11 MED ORDER — HYDROXYZINE HCL 50 MG PO TABS
25.0000 mg | ORAL_TABLET | Freq: Four times a day (QID) | ORAL | Status: DC | PRN
  Filled 2011-01-11: qty 1

## 2011-01-11 MED ORDER — METHOCARBAMOL 500 MG PO TABS
500.0000 mg | ORAL_TABLET | Freq: Four times a day (QID) | ORAL | Status: DC | PRN
  Administered 2011-01-12 – 2011-01-14 (×5): 1000 mg via ORAL
  Filled 2011-01-11 (×5): qty 2

## 2011-01-11 MED ORDER — HYDROMORPHONE HCL PF 1 MG/ML IJ SOLN
INTRAMUSCULAR | Status: AC
  Filled 2011-01-11: qty 1

## 2011-01-11 MED ORDER — LAMOTRIGINE 100 MG PO TABS
100.0000 mg | ORAL_TABLET | Freq: Every day | ORAL | Status: DC
  Administered 2011-01-12 – 2011-01-14 (×3): 100 mg via ORAL
  Filled 2011-01-11 (×4): qty 1

## 2011-01-11 MED ORDER — GLYCOPYRROLATE 0.2 MG/ML IJ SOLN
INTRAMUSCULAR | Status: DC | PRN
  Administered 2011-01-11: 0.2 mg via INTRAVENOUS
  Administered 2011-01-11: .8 mg via INTRAVENOUS

## 2011-01-11 MED ORDER — ENOXAPARIN SODIUM 40 MG/0.4ML ~~LOC~~ SOLN
40.0000 mg | SUBCUTANEOUS | Status: DC
  Administered 2011-01-12 – 2011-01-13 (×2): 40 mg via SUBCUTANEOUS
  Filled 2011-01-11 (×4): qty 0.4

## 2011-01-11 MED ORDER — METOCLOPRAMIDE HCL 5 MG/ML IJ SOLN
5.0000 mg | Freq: Three times a day (TID) | INTRAMUSCULAR | Status: DC | PRN
  Filled 2011-01-11: qty 2

## 2011-01-11 MED ORDER — PROPOFOL 10 MG/ML IV EMUL
INTRAVENOUS | Status: DC | PRN
  Administered 2011-01-11: 200 mg via INTRAVENOUS

## 2011-01-11 MED ORDER — MEPERIDINE HCL 25 MG/ML IJ SOLN: 6.2500 mg | INTRAMUSCULAR | Status: DC | PRN

## 2011-01-11 MED ORDER — VITAMIN D3 25 MCG (1000 UNIT) PO TABS
5000.0000 [IU] | ORAL_TABLET | Freq: Every day | ORAL | Status: DC
  Administered 2011-01-11 – 2011-01-14 (×4): 5000 [IU] via ORAL
  Filled 2011-01-11 (×4): qty 5

## 2011-01-11 MED ORDER — HYDROMORPHONE HCL PF 1 MG/ML IJ SOLN: 0.5000 mg | INTRAMUSCULAR | Status: DC | PRN

## 2011-01-11 MED ORDER — METOCLOPRAMIDE HCL 10 MG PO TABS: 5.0000 mg | ORAL_TABLET | Freq: Three times a day (TID) | ORAL | Status: DC | PRN

## 2011-01-11 MED ORDER — ONDANSETRON HCL 4 MG/2ML IJ SOLN: 4.0000 mg | Freq: Once | INTRAMUSCULAR | Status: DC | PRN

## 2011-01-11 MED ORDER — ROCURONIUM BROMIDE 100 MG/10ML IV SOLN
INTRAVENOUS | Status: DC | PRN
  Administered 2011-01-11: 50 mg via INTRAVENOUS

## 2011-01-11 MED ORDER — NALOXONE HCL 0.4 MG/ML IJ SOLN: 0.4000 mg | INTRAMUSCULAR | Status: DC | PRN

## 2011-01-11 MED ORDER — DIPHENHYDRAMINE HCL 50 MG/ML IJ SOLN: 12.5000 mg | Freq: Four times a day (QID) | INTRAMUSCULAR | Status: DC | PRN

## 2011-01-11 MED ORDER — LACTATED RINGERS IV SOLN
INTRAVENOUS | Status: DC | PRN
  Administered 2011-01-11 (×3): via INTRAVENOUS

## 2011-01-11 MED ORDER — CALCIUM CITRATE 950 (200 CA) MG PO TABS
200.0000 mg | ORAL_TABLET | Freq: Every day | ORAL | Status: DC
  Administered 2011-01-11 – 2011-01-14 (×4): 200 mg via ORAL
  Filled 2011-01-11 (×4): qty 1

## 2011-01-11 MED ORDER — VITAMIN C 500 MG PO TABS
500.0000 mg | ORAL_TABLET | Freq: Every day | ORAL | Status: DC
  Administered 2011-01-11 – 2011-01-14 (×4): 500 mg via ORAL
  Filled 2011-01-11 (×4): qty 1

## 2011-01-11 MED ORDER — SENNOSIDES-DOCUSATE SODIUM 8.6-50 MG PO TABS: 1.0000 | ORAL_TABLET | Freq: Every evening | ORAL | Status: DC | PRN

## 2011-01-11 MED ORDER — HYDROMORPHONE 0.3 MG/ML IV SOLN
INTRAVENOUS | Status: DC
  Administered 2011-01-11: 0.6 mg via INTRAVENOUS
  Administered 2011-01-11: 14:00:00 via INTRAVENOUS
  Administered 2011-01-11: 4.2 mL via INTRAVENOUS
  Administered 2011-01-12: 1.2 mL via INTRAVENOUS
  Administered 2011-01-12: 5.1 mL via INTRAVENOUS
  Administered 2011-01-12: 3.3 mg via INTRAVENOUS
  Filled 2011-01-11 (×2): qty 25

## 2011-01-11 MED ORDER — MORPHINE SULFATE 2 MG/ML IJ SOLN: 0.0500 mg/kg | INTRAMUSCULAR | Status: DC | PRN

## 2011-01-11 MED ORDER — POLYETHYLENE GLYCOL 3350 17 G PO PACK
17.0000 g | PACK | Freq: Every day | ORAL | Status: DC
  Administered 2011-01-11 – 2011-01-14 (×3): 17 g via ORAL
  Filled 2011-01-11 (×4): qty 1

## 2011-01-11 MED ORDER — METHOCARBAMOL 100 MG/ML IJ SOLN
500.0000 mg | Freq: Once | INTRAVENOUS | Status: AC
  Administered 2011-01-11: 500 mg via INTRAVENOUS
  Filled 2011-01-11: qty 5

## 2011-01-11 MED ORDER — FENTANYL CITRATE 0.05 MG/ML IJ SOLN
INTRAMUSCULAR | Status: DC | PRN
  Administered 2011-01-11: 50 ug via INTRAVENOUS
  Administered 2011-01-11: 100 ug via INTRAVENOUS
  Administered 2011-01-11 (×6): 50 ug via INTRAVENOUS
  Administered 2011-01-11: 100 ug via INTRAVENOUS
  Administered 2011-01-11 (×2): 50 ug via INTRAVENOUS
  Administered 2011-01-11: 100 ug via INTRAVENOUS

## 2011-01-11 MED ORDER — METHOCARBAMOL 500 MG PO TABS: 500.0000 mg | ORAL_TABLET | Freq: Four times a day (QID) | ORAL | Status: DC | PRN

## 2011-01-11 MED ORDER — MIDAZOLAM HCL 5 MG/5ML IJ SOLN
INTRAMUSCULAR | Status: DC | PRN
  Administered 2011-01-11: 2 mg via INTRAVENOUS

## 2011-01-11 MED ORDER — VECURONIUM BROMIDE 10 MG IV SOLR
INTRAVENOUS | Status: DC | PRN
  Administered 2011-01-11: 1 mg via INTRAVENOUS
  Administered 2011-01-11: 2 mg via INTRAVENOUS
  Administered 2011-01-11: 3 mg via INTRAVENOUS

## 2011-01-11 MED ORDER — PHENYLEPHRINE HCL 10 MG/ML IJ SOLN
INTRAMUSCULAR | Status: DC | PRN
  Administered 2011-01-11: 40 ug via INTRAVENOUS
  Administered 2011-01-11: 80 ug via INTRAVENOUS
  Administered 2011-01-11: 40 ug via INTRAVENOUS

## 2011-01-11 MED ORDER — HYDROMORPHONE HCL PF 1 MG/ML IJ SOLN
0.2500 mg | INTRAMUSCULAR | Status: AC | PRN
  Administered 2011-01-11 (×8): 0.5 mg via INTRAVENOUS

## 2011-01-11 MED ORDER — DIPHENHYDRAMINE HCL 12.5 MG/5ML PO ELIX
12.5000 mg | ORAL_SOLUTION | ORAL | Status: DC | PRN
  Filled 2011-01-11: qty 10

## 2011-01-11 MED ORDER — CEFAZOLIN SODIUM 1-5 GM-% IV SOLN
INTRAVENOUS | Status: DC | PRN
  Administered 2011-01-11: 1 g via INTRAVENOUS

## 2011-01-11 MED ORDER — ONDANSETRON HCL 4 MG PO TABS
4.0000 mg | ORAL_TABLET | Freq: Four times a day (QID) | ORAL | Status: DC | PRN
  Administered 2011-01-13: 4 mg via ORAL
  Filled 2011-01-11 (×2): qty 1

## 2011-01-11 MED ORDER — HYDROMORPHONE HCL PF 1 MG/ML IJ SOLN: 0.5000 mg | INTRAMUSCULAR | Status: AC

## 2011-01-11 MED ORDER — CEFAZOLIN SODIUM 1-5 GM-% IV SOLN
1.0000 g | Freq: Four times a day (QID) | INTRAVENOUS | Status: AC
  Administered 2011-01-11 – 2011-01-12 (×3): 1 g via INTRAVENOUS
  Filled 2011-01-11 (×3): qty 50

## 2011-01-11 MED ORDER — DOCUSATE SODIUM 100 MG PO CAPS
100.0000 mg | ORAL_CAPSULE | Freq: Two times a day (BID) | ORAL | Status: DC
Start: 2011-01-11 — End: 2011-01-14
  Administered 2011-01-11 – 2011-01-14 (×6): 100 mg via ORAL
  Filled 2011-01-11 (×7): qty 1

## 2011-01-11 MED ORDER — KETOROLAC TROMETHAMINE 30 MG/ML IJ SOLN
INTRAMUSCULAR | Status: AC
  Administered 2011-01-11: 30 mg via INTRAVENOUS
  Filled 2011-01-11: qty 1

## 2011-01-11 MED ORDER — ONDANSETRON HCL 4 MG/2ML IJ SOLN: 4.0000 mg | Freq: Four times a day (QID) | INTRAMUSCULAR | Status: DC | PRN

## 2011-01-11 SURGICAL SUPPLY — 54 items
BANDAGE ELASTIC 6 VELCRO ST LF (GAUZE/BANDAGES/DRESSINGS) ×2 IMPLANT
BIT DRILL LONG 4.0MM (BIT) ×2 IMPLANT
BIT DRILL SHORT 4.0 (BIT) ×2 IMPLANT
BNDG COHESIVE 6X5 TAN STRL LF (GAUZE/BANDAGES/DRESSINGS) ×2 IMPLANT
BRUSH SCRUB DISP (MISCELLANEOUS) ×4 IMPLANT
CLOTH BEACON ORANGE TIMEOUT ST (SAFETY) ×2 IMPLANT
COVER SURGICAL LIGHT HANDLE (MISCELLANEOUS) ×2 IMPLANT
DRAPE ORTHO SPLIT 77X108 STRL (DRAPES) ×2
DRAPE PROXIMA HALF (DRAPES) ×4 IMPLANT
DRAPE STERI IOBAN 125X83 (DRAPES) IMPLANT
DRAPE SURG ORHT 6 SPLT 77X108 (DRAPES) ×2 IMPLANT
DRAPE U-SHAPE 47X51 STRL (DRAPES) IMPLANT
DRILL BIT LONG 4.0MM (BIT) ×4
DRILL BIT SHORT 4.0 (BIT) ×2
DRILL STEP  6.4 (BIT) ×2 IMPLANT
DRSG EMULSION OIL 3X3 NADH (GAUZE/BANDAGES/DRESSINGS) IMPLANT
DRSG MEPILEX BORDER 4X4 (GAUZE/BANDAGES/DRESSINGS) IMPLANT
DRSG MEPILEX BORDER 4X8 (GAUZE/BANDAGES/DRESSINGS) IMPLANT
ELECT REM PT RETURN 9FT ADLT (ELECTROSURGICAL) ×2
ELECTRODE REM PT RTRN 9FT ADLT (ELECTROSURGICAL) ×1 IMPLANT
GLOVE BIO SURGEON STRL SZ7.5 (GLOVE) ×2 IMPLANT
GLOVE BIO SURGEON STRL SZ8 (GLOVE) ×2 IMPLANT
GLOVE BIOGEL PI IND STRL 7.5 (GLOVE) ×1 IMPLANT
GLOVE BIOGEL PI IND STRL 8 (GLOVE) ×1 IMPLANT
GLOVE BIOGEL PI INDICATOR 7.5 (GLOVE) ×1
GLOVE BIOGEL PI INDICATOR 8 (GLOVE) ×1
GOWN PREVENTION PLUS XLARGE (GOWN DISPOSABLE) ×2 IMPLANT
GOWN STRL NON-REIN LRG LVL3 (GOWN DISPOSABLE) ×4 IMPLANT
GUIDE ROD 3.0 (MISCELLANEOUS) ×2
KIT BASIN OR (CUSTOM PROCEDURE TRAY) ×2 IMPLANT
KIT ROOM TURNOVER OR (KITS) ×2 IMPLANT
MANIFOLD NEPTUNE II (INSTRUMENTS) ×2 IMPLANT
NAIL FEMORAL 11.5X34RT (Nail) ×2 IMPLANT
NS IRRIG 1000ML POUR BTL (IV SOLUTION) ×2 IMPLANT
PACK GENERAL/GYN (CUSTOM PROCEDURE TRAY) ×2 IMPLANT
PAD ARMBOARD 7.5X6 YLW CONV (MISCELLANEOUS) ×2 IMPLANT
REAMER ROD DEEP FLUTE 2.5X950 (INSTRUMENTS) ×4 IMPLANT
ROD GUIDE 3.0 (MISCELLANEOUS) ×1 IMPLANT
SCREW 6.4X75 (Screw) ×2 IMPLANT
SCREW 6.4X85 (Screw) ×2 IMPLANT
SCREW LOCKING 5MM X 35MM (Screw) ×2 IMPLANT
SCREW LOCKING 5MM X 40MM (Screw) ×2 IMPLANT
STAPLER VISISTAT 35W (STAPLE) ×2 IMPLANT
STOCKINETTE IMPERVIOUS LG (DRAPES) ×2 IMPLANT
SUT ETHILON 3 0 PS 1 (SUTURE) ×2 IMPLANT
SUT VIC AB 0 CT1 27 (SUTURE)
SUT VIC AB 0 CT1 27XBRD ANBCTR (SUTURE) IMPLANT
SUT VIC AB 1 CT1 27 (SUTURE)
SUT VIC AB 1 CT1 27XBRD ANBCTR (SUTURE) IMPLANT
SUT VIC AB 2-0 CT1 27 (SUTURE) ×1
SUT VIC AB 2-0 CT1 TAPERPNT 27 (SUTURE) ×1 IMPLANT
TOWEL OR 17X24 6PK STRL BLUE (TOWEL DISPOSABLE) ×2 IMPLANT
TOWEL OR 17X26 10 PK STRL BLUE (TOWEL DISPOSABLE) ×4 IMPLANT
WATER STERILE IRR 1000ML POUR (IV SOLUTION) IMPLANT

## 2011-01-11 NOTE — Anesthesia Preprocedure Evaluation (Addendum)
Anesthesia Evaluation  Patient identified by MRN, date of birth, ID band Patient awake    Reviewed: Allergy & Precautions, H&P , NPO status , Patient's Chart, lab work & pertinent test results, reviewed documented beta blocker date and time   Airway       Dental   Pulmonary Current Smoker (quit 3 wks ago, 1.5 packs/day prior to 3 weeks ago),          Cardiovascular neg cardio ROS     Neuro/Psych  Headaches, PSYCHIATRIC DISORDERS Depression Bipolar Disorder    GI/Hepatic PUD (hx in 1997, pt states possibly gall bladder),   Endo/Other    Renal/GU      Musculoskeletal   Abdominal   Peds  Hematology   Anesthesia Other Findings   Reproductive/Obstetrics                           Anesthesia Physical Anesthesia Plan  ASA: II  Anesthesia Plan: General   Post-op Pain Management:    Induction: Intravenous  Airway Management Planned: Oral ETT  Additional Equipment:   Intra-op Plan:   Post-operative Plan: Extubation in OR  Informed Consent: I have reviewed the patients History and Physical, chart, labs and discussed the procedure including the risks, benefits and alternatives for the proposed anesthesia with the patient or authorized representative who has indicated his/her understanding and acceptance.   Dental advisory given  Plan Discussed with: CRNA and Surgeon  Anesthesia Plan Comments:         Anesthesia Quick Evaluation

## 2011-01-11 NOTE — Transfer of Care (Signed)
Immediate Anesthesia Transfer of Care Note  Patient: [name]  Procedure(s) Performed:  INTRAMEDULLARY (IM) NAIL FEMORAL - Right IM Nail Exchange Right Femoral Shaft   Patient Location: PACU  Anesthesia Type: General  Level of Consciousness: awake and alert   Airway & Oxygen Therapy: Patient Spontanous Breathing and Patient connected to face mask oxygen  Post-op Assessment: Report given to PACU RN and Post -op Vital signs reviewed and stable  Post vital signs: Reviewed and stable  Complications: No apparent anesthesia complications

## 2011-01-11 NOTE — Procedures (Signed)
Pt had foley cath present upon admission to pacu.

## 2011-01-11 NOTE — Brief Op Note (Signed)
01/11/2011  12:08 PM  PATIENT:  [name]  40 y.o. female  PRE-OPERATIVE DIAGNOSIS:  right femoral non-union   POST-OPERATIVE DIAGNOSIS:  right femoral non-union   PROCEDURE:  Procedure(s): INTRAMEDULLARY (IM) NAIL FEMORAL  SURGEON:  Surgeon(s): [employer]  PHYSICIAN ASSISTANT:   ASSISTANTS: Montez Morita, PAC   ANESTHESIA:   general  EBL:  Total I/O In: 3000 [I.V.:2000; IV Piggyback:1000] Out: 700 [Urine:500; Blood:200]  BLOOD ADMINISTERED:none  DRAINS: none   LOCAL MEDICATIONS USED:  NONE  SPECIMEN:  Source of Specimen:  reamings R Femur  DISPOSITION OF SPECIMEN:  Microbiology  COUNTS:  YES  TOURNIQUET:  * No tourniquets in log *  DICTATION: .Other Dictation: Dictation Number [id number]  PLAN OF CARE: Admit to inpatient   PATIENT DISPOSITION:  PACU - hemodynamically stable.   Delay start of Pharmacological VTE agent (>24hrs) due to surgical blood loss or risk of bleeding:  NOT APPLICABLE

## 2011-01-11 NOTE — Anesthesia Postprocedure Evaluation (Signed)
  Anesthesia Post-op Note  Patient: [name]  Procedure(s) Performed:  INTRAMEDULLARY (IM) NAIL FEMORAL - Right IM Nail Exchange Right Femoral Shaft   Patient Location: PACU  Anesthesia Type: General  Level of Consciousness: sedated  Airway and Oxygen Therapy: Patient Spontanous Breathing and Patient connected to face mask oxygen  Post-op Pain: moderate  Post-op Assessment: Post-op Vital signs reviewed, Patient's Cardiovascular Status Stable, Respiratory Function Stable, Patent Airway and No signs of Nausea or vomiting  Post-op Vital Signs: Reviewed and stable  Complications: No apparent anesthesia complications

## 2011-01-11 NOTE — Transfer of Care (Incomplete)
Immediate Anesthesia Transfer of Care Note  Patient: [name]  Procedure(s) Performed:  INTRAMEDULLARY (IM) NAIL FEMORAL - Right IM Nail Exchange Right Femoral Shaft   Patient Location: PACU  Anesthesia Type: General  Level of Consciousness: awake, alert  and oriented  Airway & Oxygen Therapy: Patient Spontanous Breathing and Patient connected to face mask oxygen  Post-op Assessment: Report given to PACU RN  Post vital signs: Reviewed and stable  Complications: No apparent anesthesia complications

## 2011-01-11 NOTE — Addendum Note (Signed)
Addendum  created 01/11/11 1413 by Rivka Barbara, MD   Modules edited:Orders

## 2011-01-11 NOTE — Addendum Note (Signed)
Addendum  created 01/11/11 1330 by Kerby Nora, MD   Modules edited:Orders, PRL Based Order Sets

## 2011-01-11 NOTE — Procedures (Signed)
X ray done in pacu

## 2011-01-11 NOTE — Op Note (Signed)
NAMEDELINA, [name]               ACCOUNT NO.:  [id number]  MEDICAL RECORD NO.:  [id number]  LOCATION:  [facility]                         FACILITY:  [facility]  PHYSICIAN:  Doralee Albino. Carola Frost, M.D. DATE OF BIRTH:  [birth year]  DATE OF PROCEDURE:  01/11/2011 DATE OF DISCHARGE:                              OPERATIVE REPORT   PREOPERATIVE DIAGNOSES: 1. Atrophic nonunion, right subtrochanteric femur. 2. Broken femoral nail.  POSTOPERATIVE DIAGNOSES: 1. Atrophic nonunion, right subtrochanteric femur. 2. Broken femoral nail.  PROCEDURES: 1. Intramedullary nailing of the right femur for femoral nonunion. 2. Removal of deep implant, right femur, broken nail.  SURGEON:  Doralee Albino. Carola Frost, MD  ASSISTING:  Montez Morita, PA-C  ANESTHESIA:  General.  COMPLICATIONS:  None.  ESTIMATED BLOOD LOSS:  200 mL.  SPECIMENS:  Two anaerobic, aerobic cultures from reamings to micro, results pending.  DISPOSITION:  To PACU.  CONDITION:  Stable.  BRIEF SUMMARY AND INDICATION FOR PROCEDURE:  [name] is a 40 year old female who sustained a right subtroch treated with intramedullary nailing using a retrograde device.  She did not go on to unite and did smoke during her postoperative course.  I discussed with her the risk and benefits of the repair of this nonunion in the face of further smoking and agreed to the surgery provided that she would cease.  She states that she has and understands the risks to include persistent nonunion and failure of the subsequent surgery, DVT, PE, heart attack, stroke, loss of motion, symptomatic hardware, and again further surgery should there be any infection persistent nonunion or other complication. She furthermore understood that her current nail had fractured and would need to be removed and this was quite difficult and time consuming and could result in need for an open incision.  BRIEF SUMMARY OF PROCEDURE:  Ms. [name] was administered preop antibiotics, taken  operating room where anesthesia was induced.  Her right lower extremity was then prepped and draped in usual sterile fashion.  We began with remaking the anterior knee incision and carrying dissection down to the tip of the retrograde nail that was engaged with the extraction device.  We made 2 additional incisions to withdraw the locking bolts distally and then kept the nail in place or attempted to place a series of guidewires using the 2.5 mm rods from [employer]. Unfortunately, these would not clear the tapered area of the neck and as it was only a 9-mm nail, although many similar devices do accommodate that size within their cannulated regions.  We did partially withdraw the nail and under direct visualization again attempted to do this as we had within the body.  We needed to use the nail because of the fracture and the subtrochanteric mobility to get the guidewire within the broken portion of the proximal femur.  In this manner, we did eventually engage the guidewire within the nail proximally and then we were able to withdraw the additional proximal screws.  Continue with insertion bringing it out the hip proximally, I had intended to bring it out the piriformis fossa, however, because we had no control of the proximal fragment instead came out perfectly through the trochanteric entry and we chose  to change consequently to the trochanteric Recon device.  A small incision was made proximally and the cannulated starting nail system instruments, then slid over this to engage the proximal aspect of the femur.  It was drilled with the starting drill using the soft tissue protection sleeve and then the reduction finger which is a smooth hand- held reamer type device which was used to push the nail out through the knee.  In this manner, we were able to do very small percutaneous incisions, though with considerable additional operative time and fluoro exposure to atraumatically remove the  fractured portion of the nail. Once this was done and prior to doing so, actually we had been using the reamers in a retrograde fashion to facilitate removal of this broken portion going up to 11.5.  We then passed them antegrade and the 11 mm passed without any resistance or difficulty.  The 12 did encounter significant chatter.  We then because the next size up in the [facility] system was a 11.5, we did need to ream to 13 which was done in stepwise fashion, with 12.5 and 13-mm reamers.  We then placed the nail antegrade, had to use a lateral fluoro set of images to define the appropriate anteversion and then countersunk the nail slightly more locked distally using perfect circle technique, one screw which in the dynamic hole overlapped the previous nail locking bolts but the standard screw however did not and then we backslapped it to reduce the fracture and achieved compression at the nonunion site, and then placed the 2 proximal lag screws into the femoral head, making sure to confirm again our appropriate anteversion.  Final images showed appropriate reduction, anteversion, slight apex anterior translation, and no complications. Wounds were copiously irrigated and then closed in standard layered fashion.  We irrigated the knee thoroughly with extensive amount of irrigation.  Montez Morita, PA-C, assisted me throughout procedure and was absolutely necessary for safe and effective completion of the case.  He was required to manipulate the knee, the nail, placed and removed the guidewires, and also to hold valgus at the subtrochanteric region throughout reaming and placement of the device as well which was critical to the success and ultimate outcome.  PROGNOSIS:  The patient will be touchdown weightbearing on her right lower extremity immediately and can progress as tolerated.  She will continue with the bone stimulator and will be on strict smoking cessation.  I anticipate a 1-2 days  stay depending on pain with office followup.  She will have DVT prophylaxis on the hand but if adequately mobile should not require additional once discharged.     Doralee Albino. Carola Frost, M.D.     [facility]/MEDQ  D:  01/11/2011  T:  01/11/2011  Job:  [id number]

## 2011-01-11 NOTE — Anesthesia Procedure Notes (Signed)
Procedure Name: Intubation Date/Time: 01/11/2011 8:10 AM Performed by: Rossie Muskrat Pre-anesthesia Checklist: Patient identified, Timeout performed, Emergency Drugs available, Suction available and Patient being monitored Patient Re-evaluated:Patient Re-evaluated prior to inductionOxygen Delivery Method: Circle System Utilized Preoxygenation: Pre-oxygenation with 100% oxygen Intubation Type: IV induction Ventilation: Mask ventilation without difficulty Laryngoscope Size: Miller and 2 Grade View: Grade I Tube type: Oral Tube size: 7.5 mm Number of attempts: 1 Airway Equipment and Method: stylet Placement Confirmation: ETT inserted through vocal cords under direct vision,  breath sounds checked- equal and bilateral and positive ETCO2 Secured at: 20 cm Tube secured with: Tape Dental Injury: Teeth and Oropharynx as per pre-operative assessment

## 2011-01-11 NOTE — Procedures (Signed)
MD aware pt cries intermittently in pacu due to excessive pain.  Additional orders have been ordered, and carried out,  per Dr Ivin Booty and Dr Katrinka Blazing.

## 2011-01-12 LAB — CBC
Hemoglobin: 7.5 g/dL — ABNORMAL LOW (ref 12.0–15.0)
MCH: 30.5 pg (ref 26.0–34.0)
MCV: 88.2 fL (ref 78.0–100.0)
RBC: 2.46 MIL/uL — ABNORMAL LOW (ref 3.87–5.11)

## 2011-01-12 LAB — BASIC METABOLIC PANEL
BUN: 18 mg/dL (ref 6–23)
CO2: 28 mEq/L (ref 19–32)
Glucose, Bld: 102 mg/dL — ABNORMAL HIGH (ref 70–99)
Potassium: 3.8 mEq/L (ref 3.5–5.1)
Sodium: 138 mEq/L (ref 135–145)

## 2011-01-12 MED ORDER — OXYCODONE-ACETAMINOPHEN 5-325 MG PO TABS
1.0000 | ORAL_TABLET | Freq: Four times a day (QID) | ORAL | Status: DC | PRN
  Administered 2011-01-12 – 2011-01-14 (×6): 2 via ORAL
  Filled 2011-01-12 (×6): qty 2

## 2011-01-12 NOTE — Progress Notes (Signed)
Physical Therapy Evaluation Patient Details Name: [name] MRN: [id number] DOB: June 02, 1970 Today's Date: 01/12/2011  Problem List:  Patient Active Problem List  Diagnoses  . Closed fracture of femur with nonunion  . Trichimoniasis  . Depression  . Bipolar 1 disorder, depressed  . Ulcer  . Low back pain  . Headache    Past Medical History:  Past Medical History  Diagnosis Date  . Ulcer 1997    treated and resolved  . Headache     occassional migraines  . Low back pain   . Depression   . Bipolar 1 disorder, depressed     takes lamictal  . Shingles     has had in pass month, completed treatment week of 01/02/2011, on forehead and near eye  . UTI (lower urinary tract infection)     placed on antibiotic 01/10/11 but unsure of name   Past Surgical History:  Past Surgical History  Procedure Date  . Foot fusion   . Femur fracture surgery   . Cesarean section     x2  . Tubal ligation   . Cholecystectomy   . Tonsillectomy   . Tonsilectomy, adenoidectomy, bilateral myringotomy and tubes   . Wisdom tooth extraction     PT Assessment/Plan/Recommendation PT Assessment Clinical Impression Statement: pt would benefit from skilled PT to improve overall mobility and safety.   PT Recommendation/Assessment: Patient will need skilled PT in the acute care venue PT Problem List: Decreased strength;Decreased range of motion;Decreased activity tolerance;Decreased balance;Decreased mobility;Decreased knowledge of use of DME;Decreased knowledge of precautions;Pain Barriers to Discharge: None PT Therapy Diagnosis : Abnormality of gait;Acute pain PT Plan PT Frequency: Min 6X/week PT Treatment/Interventions: DME instruction;Gait training;Functional mobility training;Therapeutic exercise;Balance training;Patient/family education PT Recommendation Follow Up Recommendations: Home health PT Equipment Recommended: None recommended by PT PT Goals  Acute Rehab PT Goals PT Goal  Formulation: With patient Time For Goal Achievement: 2 weeks Pt will go Supine/Side to Sit: Independently PT Goal: Supine/Side to Sit - Progress: Progressing toward goal Pt will Transfer Sit to Stand/Stand to Sit: with supervision;with upper extremity assist PT Transfer Goal: Sit to Stand/Stand to Sit - Progress: Progressing toward goal Pt will Ambulate: 51 - 150 feet;with supervision;with rolling walker PT Goal: Ambulate - Progress: Progressing toward goal  PT Evaluation Precautions/Restrictions  Restrictions Weight Bearing Restrictions: Yes RLE Weight Bearing: Weight bearing as tolerated Prior Functioning  Home Living Lives With: Spouse;Son;Daughter Receives Help From: Family Type of Home: House Home Layout: Two level (bed and bathroom in basement level) Home Access: Level entry (Flat entry at basement level) Bathroom Shower/Tub: Walk-in shower Home Adaptive Equipment: Bedside commode/3-in-1;Crutches;Shower chair with back;Walker - rolling Prior Function Level of Independence: Independent with basic ADLs;Independent with homemaking with ambulation (prior to initial fx in June) Able to Take Stairs?: Reciprically Driving: Yes Cognition Cognition Orientation Level: Oriented X4 Sensation/Coordination   Extremity Assessment RLE Assessment RLE Assessment: Exceptions to [facility] RLE Strength RLE Overall Strength Comments: pt limited secondary surgical pain and weakness LLE Assessment LLE Assessment: Within Functional Limits Mobility (including Balance) Bed Mobility Bed Mobility: Yes Supine to Sit: 4: Min assist;With rails;HOB flat Supine to Sit Details (indicate cue type and reason): cues for technique and A with R LE Sitting - Scoot to Edge of Bed: 4: Min assist (A with R LE support) Transfers Transfers: Yes Sit to Stand: 4: Min assist Sit to Stand Details (indicate cue type and reason): cues for UE use Stand to Sit: 4: Min assist;With upper extremity assist;To  chair/3-in-1 Ambulation/Gait Ambulation/Gait: Yes Ambulation/Gait Assistance: 4: Min assist Ambulation/Gait Assistance Details (indicate cue type and reason): Cues for sequencing and postioning in RW Ambulation Distance (Feet): 3 Feet Assistive device: Rolling walker Gait Pattern: Step-to pattern Stairs: No    Exercise    End of Session PT - End of Session Equipment Utilized During Treatment: Gait belt Activity Tolerance: Patient limited by pain (pt had just gotten PCA fixed prior to starting PT) Patient left: in chair;with call bell in reach Nurse Communication: Mobility status for transfers General Behavior During Session: [facility] for tasks performed Cognition: [facility] for tasks performed  [facility], PT [phone] 01/12/2011, 1:22 PM

## 2011-01-12 NOTE — Progress Notes (Signed)
Subjective: 1 Day Post-Op Procedure(s) (LRB): INTRAMEDULLARY (IM) NAIL FEMORAL (Right) Patient reports pain as 6 on 0-10 scale.   Doing better this am. Pain slowly improving Worked with therapy this am and is now in bedside chair Denies numbness or tingling No chest pain, no SOB, No N/V/D Wants foley out today  Objective: Current Vitals Blood pressure 89/54, pulse 104, temperature 100.3 F (37.9 C), temperature source Oral, resp. rate 18, height 5\' 2"  (1.575 m), weight 63.8 kg (140 lb 10.5 oz), SpO2 93.00%. Vital signs in last 24 hours: Temp:  [97.4 F (36.3 C)-100.3 F (37.9 C)] 100.3 F (37.9 C) (11/10 0605) Pulse Rate:  [91-136] 104  (11/10 0605) Resp:  [8-24] 18  (11/10 0605) BP: (89-147)/(38-98) 89/54 mmHg (11/10 0605) SpO2:  [86 %-100 %] 93 % (11/10 0605)  Intake/Output from previous day: 11/09 0701 - 11/10 0700 In: 3500 [I.V.:2500; IV Piggyback:1000] Out: 1400 [Urine:1200; Blood:200]  LABS No results found for this basename: HGB:5 in the last 72 hours No results found for this basename: WBC:2,RBC:2,HCT:2,PLT:2 in the last 72 hours  Basename 01/12/11 0700  NA 138  K 3.8  CL 103  CO2 28  BUN 18  CREATININE 1.11*  GLUCOSE 102*  CALCIUM 9.0   No results found for this basename: LABPT:2,INR:2 in the last 72 hours  CBC: Pending  Physical Exam  Gen: appears well, NAD, sitting in chair Lungs: Clear B Cardiac: S1 and S2 Abd: + BS, NT Ext: R LEx: Dressing stable, proximal dsg is saturated but stable         Ace intact         Swelling controlled         Motor and sensory functions intact distally           + DP pulse         Compartments soft and NT                   No pain with passive stretch        Knee motion is good        Extremity is warm   Imaging Dg Femur Right Done In Or 5 By Cdr  01/11/2011  *RADIOLOGY REPORT*  Clinical Data: Revision of femoral nail.  Femur fracture.  RIGHT FEMUR - 2 VIEW  Comparison: Radiographs dated 10/22/2010  Findings:  AP and lateral C-arm images demonstrate insertion of a longer femoral nail is well as two pins through the neck of the right femur.  The healing fracture of the proximal femoral shaft is noted.  Slight displacement at the fracture in the lateral projection.  IMPRESSION: Right femoral nail revision as described.  Original Report Authenticated By: Gwynn Burly, M.D.   Dg Femur Right Port 2 View Xray R Femur  01/11/2011  *RADIOLOGY REPORT*  Clinical Data: Right femur fracture.  PORTABLE RIGHT FEMUR - 2 VIEW  Comparison: Radiographs dated 08/22/2010  Findings: The intramedullary rod seen on the prior exam has been replaced with a more extensive intramedullary rod as well as two pins through the intertrochanteric region into the right femoral head.  Callus formation is present around the proximal femoral shaft fracture.  IMPRESSION: Insertion of longer intramedullary rod in the right femur as described.  Original Report Authenticated By: Gwynn Burly, M.D.   Dg C-arm Gt 120 Min  01/11/2011  CLINICAL DATA: right femoral nail exchange   C-ARM GT 120 MIN  Fluoroscopy was utilized by the requesting physician.  No  radiographic  interpretation.      Assessment/Plan: 1 Day Post-Op Procedure(s) (LRB): INTRAMEDULLARY (IM) NAIL FEMORAL (Right)  40 y/o female s/p IMN R femur nonunion  1. R femur Nonunion s/p IMN  WBAT R LEx  ROM as tolerated  Dsg changes prn  Ice and elevate  PT/OT  No pillows under knee 2. Pre op Trichomoniasis  On flagyl, complete 7 day course 3. Psych  Continue home meds 4. H/o mirgraines  5. FEN: diet as tolerated   D/c Foley 6. ACTIVITY: WBAT, ROM as toleratd R LEx 7. DVT/PE PROPHYLAXIS: Lovenox as inpatient and for 10-14 days post op 8. Pain: D/C pca    Encourage PO meds 9. DISPO: PT/OT       Hopeful for d/c tomorrow or Monday        Will likely d/c home  Mearl Latin, PA-C 01/12/2011, 8:54 AM

## 2011-01-13 DIAGNOSIS — D62 Acute posthemorrhagic anemia: Secondary | ICD-10-CM

## 2011-01-13 LAB — CBC
MCH: 30.6 pg (ref 26.0–34.0)
MCHC: 34.1 g/dL (ref 30.0–36.0)
MCV: 89.5 fL (ref 78.0–100.0)
Platelets: 153 10*3/uL (ref 150–400)
RDW: 13.7 % (ref 11.5–15.5)

## 2011-01-13 LAB — BASIC METABOLIC PANEL
BUN: 3 mg/dL — ABNORMAL LOW (ref 6–23)
CO2: 26 mEq/L (ref 19–32)
Calcium: 8.8 mg/dL (ref 8.4–10.5)
Creatinine, Ser: 0.65 mg/dL (ref 0.50–1.10)
Glucose, Bld: 87 mg/dL (ref 70–99)

## 2011-01-13 MED ORDER — DIPHENHYDRAMINE HCL 25 MG PO CAPS
25.0000 mg | ORAL_CAPSULE | Freq: Once | ORAL | Status: AC
  Administered 2011-01-13: 25 mg via ORAL
  Filled 2011-01-13: qty 1

## 2011-01-13 MED ORDER — POLYETHYLENE GLYCOL 3350 17 G PO PACK: 17.0000 g | PACK | Freq: Every day | ORAL | Status: AC

## 2011-01-13 MED ORDER — OXYCODONE-ACETAMINOPHEN 5-325 MG PO TABS: 1.0000 | ORAL_TABLET | Freq: Four times a day (QID) | ORAL | Status: AC | PRN

## 2011-01-13 MED ORDER — FUROSEMIDE 10 MG/ML IJ SOLN
20.0000 mg | Freq: Once | INTRAMUSCULAR | Status: AC
  Administered 2011-01-13: 20 mg via INTRAVENOUS
  Filled 2011-01-13: qty 2

## 2011-01-13 MED ORDER — DSS 100 MG PO CAPS: 100.0000 mg | ORAL_CAPSULE | Freq: Two times a day (BID) | ORAL | Status: AC

## 2011-01-13 MED ORDER — ACETAMINOPHEN 325 MG PO TABS
650.0000 mg | ORAL_TABLET | Freq: Once | ORAL | Status: AC
  Administered 2011-01-13: 650 mg via ORAL
  Filled 2011-01-13: qty 2

## 2011-01-13 MED ORDER — OXYCODONE HCL 5 MG PO TABS: 5.0000 mg | ORAL_TABLET | ORAL | Status: AC | PRN

## 2011-01-13 MED ORDER — ENOXAPARIN SODIUM 40 MG/0.4ML ~~LOC~~ SOLN: 40.0000 mg | SUBCUTANEOUS | Status: DC

## 2011-01-13 NOTE — Progress Notes (Addendum)
01/13/11 Physical Therapy Note:  PT/OT/SLP Cancellation Note  PT Treatment cancelled today due to patient's polite declination to participate (fatigue)  Pt's HgB noted low, receiving blood now  DC plan remains appropriate  Will follow up tomorrow  Thanks, Van Clines, [place] [phone]

## 2011-01-13 NOTE — Progress Notes (Signed)
Subjective: 2 Days Post-Op Procedure(s) (LRB): INTRAMEDULLARY (IM) NAIL FEMORAL (Right) Patient reports pain as 2 on 0-10 scale.   Pt states that her pain is much better than she anticipated it would be  Really wants to go home today Denies cp, sob, n/v/d No lightheadedness Feeling well Tolerating diet  Objective:  Current Vitals Blood pressure 81/42, pulse 81, temperature 99 F (37.2 C), temperature source Oral, resp. rate 16, height 5\' 2"  (1.575 m), weight 63.8 kg (140 lb 10.5 oz), SpO2 95.00%. Vital signs in last 24 hours: Temp:  [97.7 F (36.5 C)-99 F (37.2 C)] 99 F (37.2 C) (11/11 0533) Pulse Rate:  [81-102] 81  (11/11 0533) Resp:  [16] 16  (11/11 0533) BP: (81-102)/(42-64) 81/42 mmHg (11/11 0533) SpO2:  [92 %-96 %] 95 % (11/11 0533)  Intake/Output from previous day: 11/10 0701 - 11/11 0700 In: 1225 [P.O.:425; I.V.:800] Out: 1600 [Urine:1600]  LABS  Basename 01/12/11 0917  HGB 7.5*    Basename 01/12/11 0917  WBC 8.6  RBC 2.46*  HCT 21.7*  PLT 174    Basename 01/12/11 0700  NA 138  K 3.8  CL 103  CO2 28  BUN 18  CREATININE 1.11*  GLUCOSE 102*  CALCIUM 9.0   No results found for this basename: LABPT:2,INR:2 in the last 72 hours   Physical Exam  Gen: NAD, resting comfortably in bed Lungs: Cleart Cardiac: S1 and S2, reg rate and rhythm Abd: + BS, NT R lower Ext: Incisions look great   Scant drainage   No signs of infection   Extremity is warm   + DP pulse   Motor and sensory functions intact   No DCT   Swelling controlled   Great knee and ankle ROM   Minimal tenderness at nonunion site   Assessment/Plan: 2 Days Post-Op Procedure(s) (LRB): INTRAMEDULLARY (IM) NAIL FEMORAL (Right)  40 y/o female s/p IMN R femur nonunion  1. R femur Nonunion s/p IMN   WBAT R LEx   ROM as tolerated   Dsg changes prn   Ice and elevate   PT/OT   No pillows under knee  2. Pre op Trichomoniasis   On flagyl, complete 7 day course  3. Psych   Continue  home meds  4. ABL anemia  Pt asymptomatic  Check H/H before d/c  If hgb <7.0 will transfuse and hold discharge 5. H/o mirgraines  6. FEN:   diet as tolerated    NSL IV for now 7. ACTIVITY:   WBAT, ROM as toleratd R LEx  8. DVT/PE PROPHYLAXIS:   Lovenox as inpatient and for 10-14 days post op  9. Pain: D/C pca   Encourage PO meds  10. DISPO:   PT/OT   Hopeful to d/c home later today  Mearl Latin, PA-C 01/13/2011, 7:22 AM

## 2011-01-13 NOTE — Discharge Summary (Signed)
PATIENT ID:      [name]  MRN:     [id number] DOB/AGE:    [birth year] / 40 y.o.     DISCHARGE SUMMARY  ADMISSION DATE:    01/11/2011 DISCHARGE DATE:  01/13/2011  ADMISSION DIAGNOSIS: right femoral non-union   DISCHARGE DIAGNOSIS:  right femoral non-union     ADDITIONAL DIAGNOSIS: Principal Problem:  *Closed fracture of femur with nonunion Active Problems:  Trichimoniasis  Acute blood loss anemia  Past Medical History  Diagnosis Date  . Ulcer 1997    treated and resolved  . Headache     occassional migraines  . Low back pain   . Depression   . Bipolar 1 disorder, depressed     takes lamictal  . Shingles     has had in pass month, completed treatment week of 01/02/2011, on forehead and near eye  . UTI (lower urinary tract infection)     placed on antibiotic 01/10/11 but unsure of name    PROCEDURE: Procedure(s): INTRAMEDULLARY (IM) NAIL FEMORAL on 01/11/2011  Transfusion 2 units PRBCs 01/14/2011  CONSULTS:  None   HISTORY: Ms. [name] is a 40 y/o female with h/o R femur fracture and R foot fxs in June of 2012. She was initially treated by Dr. Lestine Box with IMN of her R femur. Since initial surgery she has been having issues with respect to pain and has continued to smoke despite continued instructions to cease. Pt followed up with OTS regarding a R femoral shaft nonunion. Pt was admitted to the hospital the day of surgery for exchange nailing for treatment of her R femoral nonunion   HOSPITAL COURSE:  [name] is a 40 y.o. admitted on 01/11/2011 and found to have a diagnosis of right femoral non-union .  After appropriate laboratory studies were obtained  they were taken to the operating room on 01/11/2011 and underwent Procedure(s): INTRAMEDULLARY (IM) NAIL FEMORAL.   They were given perioperative antibiotics:  Anti-infectives     Start     Dose/Rate Route Frequency Ordered Stop   01/11/11 1930   metroNIDAZOLE (FLAGYL) tablet 500 mg        500 mg Oral Every 12  hours 01/11/11 1648 01/18/11 2159   01/11/11 1930   ceFAZolin (ANCEF) IVPB 1 g/50 mL premix        1 g 100 mL/hr over 30 Minutes Intravenous Every 6 hours 01/11/11 1647 01/12/11 0843   01/10/11 1600   ceFAZolin (ANCEF) IVPB 1 g/50 mL premix  Status:  Discontinued        1 g 100 mL/hr over 30 Minutes Intravenous 60 min pre-op 01/10/11 1557 01/11/11 1640        .  Physical Therapy as ordered Weight Bearing as Tolerated (WBAT).  DVT Prophylaxis:  Lovenox  POD# 1  the foley was discontinued and patient weaned off the PCA to oral pain medications. Patient was allowed out of bed and ambulated as per PT protocol. Oxygen was weaned off keeping the O2 saturations > 92%. Dressings were reinforced as needed as well.  Pt tolerated therapies well and her pain was control was optimized on pod 1 as well   POD#2 the dressing was changed. Wounds looked fantastic without any evidence of infection.  Her swelling was controlled as well.  At the time this note was written her H/H had not come back for 01/13/2011.  Previous days labs showed an Hgb/Hct of 7.5/21.7.  Pt denies any cp, sob, lightheadedness.  But will await  todays labs before giving the ok to discharge to home, o/w pt stable  The remainder of the hospital course was dedicated to ambulation and strengthening. The patient was discharged on 2 Days Post-Op in stable condition.   DIAGNOSTIC STUDIES: Recent vital signs:  Patient Vitals for the past 24 hrs:  BP Temp Temp src Pulse Resp SpO2  01/13/11 0533 81/42 mmHg 99 F (37.2 C) Oral 81  16  95 %  01/12/11 2106 86/55 mmHg 99 F (37.2 C) Oral 91  16  92 %  01/12/11 1400 102/64 mmHg 97.7 F (36.5 C) Oral 102  16  96 %     Recent laboratory studies:  [facility] 01/12/11 0917 2011-01-18 1332  WBC 8.6 13.7*  HGB 7.5* 13.5  HCT 21.7* 38.3  PLT 174 235    Basename 01/12/11 0700 01-18-2011 1332  NA 138 141  K 3.8 3.9  CL 103 102  CO2 28 28  BUN 18 7  CREATININE 1.11* 0.70  GLUCOSE 102* 83    CALCIUM 9.0 10.4   Lab Results  Component Value Date   INR 0.92 01-18-2011   INR 1.02 08/23/2010   INR 0.91 08/22/2010     Recent Radiographic Studies :  Dg Chest 2 View  January 18, 2011  *RADIOLOGY REPORT*  Clinical Data: Preoperative respiratory examination for right femur hardware removal.  CHEST - 2 VIEW  Comparison: Chest CT 08/22/2010.  Chest radiographs 08/22/2010 and 03/26/2004.  Findings: The heart size and mediastinal contours are stable.  The lungs appear clear.  There is slightly increased density at the confluence of the right sixth and ninth ribs on the frontal examination.  This is probably due to overlap of osseous and vascular structures or possibly a healing fracture.  There was no evidence of nodule in this area on the CT performed 4 months ago. No acute osseous findings are identified.  IMPRESSION: No acute cardiopulmonary process.  Original Report Authenticated By: Gerrianne Scale, M.D.   Dg Femur Right Done In Or 5 By Cdr  01/11/2011  *RADIOLOGY REPORT*  Clinical Data: Revision of femoral nail.  Femur fracture.  RIGHT FEMUR - 2 VIEW  Comparison: Radiographs dated 10/22/2010  Findings: AP and lateral C-arm images demonstrate insertion of a longer femoral nail is well as two pins through the neck of the right femur.  The healing fracture of the proximal femoral shaft is noted.  Slight displacement at the fracture in the lateral projection.  IMPRESSION: Right femoral nail revision as described.  Original Report Authenticated By: Gwynn Burly, M.D.   Dg Femur Right Port 2 View Xray R Femur  01/11/2011  *RADIOLOGY REPORT*  Clinical Data: Right femur fracture.  PORTABLE RIGHT FEMUR - 2 VIEW  Comparison: Radiographs dated 08/22/2010  Findings: The intramedullary rod seen on the prior exam has been replaced with a more extensive intramedullary rod as well as two pins through the intertrochanteric region into the right femoral head.  Callus formation is present around the proximal  femoral shaft fracture.  IMPRESSION: Insertion of longer intramedullary rod in the right femur as described.  Original Report Authenticated By: Gwynn Burly, M.D.   Dg C-arm Gt 120 Min  01/11/2011  CLINICAL DATA: right femoral nail exchange   C-ARM GT 120 MIN  Fluoroscopy was utilized by the requesting physician.  No radiographic  interpretation.      DISCHARGE INSTRUCTIONS: pt given detailed wound care and therapy instructions.  These were printed out and given to the patient at  the time of discharge   DISCHARGE MEDICATIONS:   Current Discharge Medication List    START taking these medications   Details  docusate sodium 100 MG CAPS Take 100 mg by mouth 2 (two) times daily. Qty: 10 capsule, Refills: 1    enoxaparin (LOVENOX) 40 MG/0.4ML SOLN Inject 0.4 mLs (40 mg total) into the skin daily. Qty: 14 Syringe, Refills: 0    oxyCODONE (OXY IR/ROXICODONE) 5 MG immediate release tablet Take 1-2 tablets (5-10 mg total) by mouth every 3 (three) hours as needed. Qty: 80 tablet, Refills: 0    oxyCODONE-acetaminophen (PERCOCET) 5-325 MG per tablet Take 1-2 tablets by mouth every 6 (six) hours as needed. Qty: 100 tablet, Refills: 0    polyethylene glycol (MIRALAX / GLYCOLAX) packet Take 17 g by mouth daily. Qty: 14 each, Refills: 0      CONTINUE these medications which have NOT CHANGED   Details  Calcium Citrate-Vitamin D (CALCIUM CITRATE +D PO) Take 1 tablet by mouth daily.      Cholecalciferol (VITAMIN D-3) 5000 UNITS TABS Take 1 capsule by mouth daily.      lamoTRIgine (LAMICTAL) 100 MG tablet Take 100 mg by mouth daily.      methocarbamol (ROBAXIN) 500 MG tablet Take 500 mg by mouth 2 (two) times daily as needed. For muscle spasm.     vitamin C (ASCORBIC ACID) 500 MG tablet Take 500 mg by mouth daily.      metroNIDAZOLE (FLAGYL) 500 MG tablet Take 500 mg by mouth 3 (three) times daily.        STOP taking these medications     HYDROcodone-acetaminophen (NORCO) 5-325 MG per  tablet      ibuprofen (ADVIL,MOTRIN) 200 MG tablet      L-Arginine 500 MG CAPS         FOLLOW UP VISIT:   Follow-up Information    Follow up with HANDY,MICHAEL H in 14 days. (call for appointment)    Contact information:   [address] [place] [address] [phone]          DISCHARGE BJ:YNWG IN Stable Condition   Mearl Latin, PA-C 01/13/2011, 7:56 AM    addendum:  Pt had an hgb of 7.0 yesterday.  With this information as well as the patient being slightly hypotensive, we decided to transfuse 2 units of PRBC's.  Pt tolerated well and did not have any acute issues  She was stable for d/c on 01/14/2011,  Please see above d/c  Summary  Mearl Latin, PA-C 01/14/2011 [phone]

## 2011-01-13 NOTE — Progress Notes (Signed)
Case Management:  01/13/11 1045 Tera Mater, RN, BSN Case Manager [phone] Spoke with pt. concerning [facility] services and pt. stated she did not want HH.  She stated she had HH PT prior to admission, and she feels comfortable going home without assistance.  Explained to pt. that the doctor did recommend this service, and pt. still refused HH.  NCM will continue to follow for further discharge needs.

## 2011-01-14 LAB — CBC
HCT: 28.2 % — ABNORMAL LOW (ref 36.0–46.0)
Hemoglobin: 9.6 g/dL — ABNORMAL LOW (ref 12.0–15.0)
MCH: 29.2 pg (ref 26.0–34.0)
MCHC: 34 g/dL (ref 30.0–36.0)
MCV: 85.7 fL (ref 78.0–100.0)
Platelets: 172 10*3/uL (ref 150–400)
RBC: 3.29 MIL/uL — ABNORMAL LOW (ref 3.87–5.11)
RDW: 16 % — ABNORMAL HIGH (ref 11.5–15.5)
WBC: 9.7 10*3/uL (ref 4.0–10.5)

## 2011-01-14 LAB — TYPE AND SCREEN
ABO/RH(D): O POS
Unit division: 0

## 2011-01-14 LAB — BASIC METABOLIC PANEL WITH GFR
BUN: 7 mg/dL (ref 6–23)
CO2: 26 meq/L (ref 19–32)
Calcium: 8.7 mg/dL (ref 8.4–10.5)
Chloride: 110 meq/L (ref 96–112)
Creatinine, Ser: 0.73 mg/dL (ref 0.50–1.10)
GFR calc Af Amer: >90 mL/min
GFR calc non Af Amer: >90 mL/min
Glucose, Bld: 110 mg/dL — ABNORMAL HIGH (ref 70–99)
Potassium: 4 meq/L (ref 3.5–5.1)
Sodium: 142 meq/L (ref 135–145)

## 2011-01-14 LAB — TISSUE CULTURE

## 2011-01-14 NOTE — Discharge Summary (Signed)
Patient is being discharged from OT services secondary to:  All education is complete. Pt has all necessary DME and assistance for safe d/c home. No further acute therapy needs identified. No f/u OT needed.   Please see latest Therapy Progress Note for current level of functioning and progress toward goals.  Progress and discharge plan and discussed with patient/caregiver and they Agree.   01/14/2011 Lucile Shutters   OTR/L Pager: [phone] Office: [phone] .

## 2011-01-14 NOTE — Progress Notes (Signed)
Occupational Therapy Evaluation Patient Details Name: [name] MRN: [id number] DOB: 1970-03-16 Today's Date: 01/14/2011  Problem List:  Patient Active Problem List  Diagnoses  . Closed fracture of femur with nonunion  . Trichimoniasis  . Depression  . Bipolar 1 disorder, depressed  . Ulcer  . Low back pain  . Headache  . Acute blood loss anemia    Past Medical History:  Past Medical History  Diagnosis Date  . Ulcer 1997    treated and resolved  . Headache     occassional migraines  . Low back pain   . Depression   . Bipolar 1 disorder, depressed     takes lamictal  . Shingles     has had in pass month, completed treatment week of 01/02/2011, on forehead and near eye  . UTI (lower urinary tract infection)     placed on antibiotic 01/10/11 but unsure of name   Past Surgical History:  Past Surgical History  Procedure Date  . Foot fusion   . Femur fracture surgery   . Cesarean section     x2  . Tubal ligation   . Cholecystectomy   . Tonsillectomy   . Tonsilectomy, adenoidectomy, bilateral myringotomy and tubes   . Wisdom tooth extraction     OT Assessment/Plan/Recommendation OT Assessment Clinical Impression Statement: Pt. does not have any acute care OT needs at this time. Pt has all necessary DME and A for safe d/c home. No f/u OT needed. All education complete. Will sign off.  OT Recommendation/Assessment: Patient does not need any further OT services OT Recommendation Equipment Recommended: None recommended by PT OT Goals    OT Evaluation Precautions/Restrictions  Precautions Required Braces or Orthoses: No Restrictions Weight Bearing Restrictions: Yes RLE Weight Bearing: Weight bearing as tolerated Prior Functioning Home Living Lives With: Spouse;Son;Daughter Receives Help From: Family Type of Home: House Home Layout: Two level Home Access: Level entry Bathroom Shower/Tub: Health visitor: Handicapped height Bathroom  Accessibility: Yes How Accessible: Accessible via wheelchair;Accessible via walker Home Adaptive Equipment: Bedside commode/3-in-1;Crutches;Shower chair with back;Walker - rolling Prior Function Level of Independence: Independent with basic ADLs;Independent with homemaking with ambulation Able to Take Stairs?: Reciprically Driving: Yes ADL ADL Eating/Feeding: Simulated;Modified independent Where Assessed - Eating/Feeding: Bed level Grooming: Performed;Wash/dry hands;Wash/dry face;Teeth care;Brushing hair;Set up Where Assessed - Grooming: Standing at sink Upper Body Bathing: Performed;Chest;Right arm;Left arm;Abdomen;Set up Where Assessed - Upper Body Bathing: Sitting, bed Lower Body Bathing: Performed;Set up Where Assessed - Lower Body Bathing: Sit to stand from bed Upper Body Dressing: Performed;Modified independent Where Assessed - Upper Body Dressing: Sitting, bed;Unsupported Lower Body Dressing: Simulated;Set up Where Assessed - Lower Body Dressing: Sit to stand from bed Toilet Transfer: Performed;Modified independent Toilet Transfer Method: Stand pivot Acupuncturist: Bedside commode Toileting - Clothing Manipulation: Performed;Modified independent Where Assessed - Toileting Clothing Manipulation: Sit to stand from 3-in-1 or toilet Toileting - Hygiene: Performed;Modified independent Where Assessed - Toileting Hygiene: Sit on 3-in-1 or toilet Tub/Shower Transfer: Simulated;Modified independent Tub/Shower Transfer Method: Science writer: Shower seat with back;Walk in shower Equipment Used: Rolling walker;Other (comment) (Gait Belt; 3:1) Vision/Perception  Vision - History Baseline Vision: No visual deficits Patient Visual Report: No change from baseline Vision - Assessment Eye Alignment: Within Functional Limits Vision Assessment: Vision not tested Perception Perception: Within Functional Limits Praxis Praxis:  Intact Cognition Cognition Arousal/Alertness: Awake/alert Overall Cognitive Status: Appears within functional limits for tasks assessed Orientation Level: Oriented X4 Sensation/Coordination Coordination Gross Motor Movements are  Fluid and Coordinated: Yes Fine Motor Movements are Fluid and Coordinated: Yes Extremity Assessment RUE Assessment RUE Assessment: Within Functional Limits LUE Assessment LUE Assessment: Within Functional Limits Mobility  Bed Mobility Bed Mobility: Yes Supine to Sit: 6: Modified independent (Device/Increase time) Sitting - Scoot to Edge of Bed: 6: Modified independent (Device/Increase time) Transfers Transfers: Yes Sit to Stand: 5: Supervision Stand to Sit: 5: Supervision Exercises   End of Session OT - End of Session Equipment Utilized During Treatment: Gait belt Activity Tolerance: Patient tolerated treatment well Patient left: in bed;with call bell in reach;Other (comment) (RN in room) General Behavior During Session: [facility] for tasks performed Cognition: [facility] for tasks performed   Otis Peak OTS 01/14/2011, 12:59 PM   01/14/2011 Lucile Shutters   OTR/L Pager: [phone] Office: [phone] .

## 2011-01-14 NOTE — Progress Notes (Signed)
Physical Therapy Treatment Patient Details Name: [name] MRN: [id number] DOB: 05-18-1970 Today's Date: 01/14/2011  PT Assessment/Plan  PT - Assessment/Plan Comments on Treatment Session: Pt is progressing well with all functional mobility. Plan to d/c today home with HHPT PT Plan: Discharge plan remains appropriate PT Frequency: Min 6X/week Follow Up Recommendations: Home health PT Equipment Recommended: None recommended by PT PT Goals  Acute Rehab PT Goals PT Goal Formulation: With patient Time For Goal Achievement: 2 weeks PT Goal: Supine/Side to Sit - Progress: Progressing toward goal PT Transfer Goal: Sit to Stand/Stand to Sit - Progress: Met PT Goal: Ambulate - Progress: Met  PT Treatment Precautions/Restrictions  Precautions Required Braces or Orthoses: No Restrictions Weight Bearing Restrictions: Yes RLE Weight Bearing: Weight bearing as tolerated Mobility (including Balance) Bed Mobility Bed Mobility: Yes Supine to Sit: 6: Modified independent (Device/Increase time) Sitting - Scoot to Edge of Bed: 6: Modified independent (Device/Increase time) Transfers Transfers: Yes Sit to Stand: 6: Modified independent (Device/Increase time) Stand to Sit: 6: Modified independent (Device/Increase time) Ambulation/Gait Ambulation/Gait: Yes Ambulation/Gait Assistance: 5: Supervision Ambulation/Gait Assistance Details (indicate cue type and reason): Cues to increase knee extension and bear more weight through RLE Ambulation Distance (Feet): 400 Feet Assistive device: Rolling walker Gait Pattern: Step-to pattern;Decreased hip/knee flexion - right;Decreased weight shift to right Gait velocity: decreased gait speed    Exercise    End of Session General Behavior During Session: [facility] for tasks performed Cognition: [facility] for tasks performed  Milana Kidney 01/14/2011, 1:37 PM

## 2011-01-14 NOTE — Progress Notes (Signed)
Subjective: 3 Days Post-Op Procedure(s) (LRB): INTRAMEDULLARY (IM) NAIL FEMORAL (Right) Patient reports pain as 2 on 0-10 scale.   Doing much better, feels better after 2 units of prbc's yesterday Ready to go home today  Objective: Current Vitals Blood pressure 95/63, pulse 79, temperature 98 F (36.7 C), temperature source Oral, resp. rate 16, height 5\' 2"  (1.575 m), weight 63.8 kg (140 lb 10.5 oz), SpO2 94.00%. Vital signs in last 24 hours: Temp:  [98 F (36.7 C)-98.8 F (37.1 C)] 98 F (36.7 C) (11/12 0510) Pulse Rate:  [66-79] 79  (11/12 0510) Resp:  [16-20] 16  (11/12 0510) BP: (85-101)/(43-63) 95/63 mmHg (11/12 0510) SpO2:  [94 %] 94 % (11/12 0510)  Intake/Output from previous day: 11/11 0701 - 11/12 0700 In: 1400 [P.O.:600; I.V.:100; Blood:700] Out: -   LABS  Basename 01/14/11 0547 01/13/11 0510 01/12/11 0917  HGB 9.6* 7.0* 7.5*    Basename 01/14/11 0547 01/13/11 0510  WBC 9.7 8.3  RBC 3.29* 2.29*  HCT 28.2* 20.5*  PLT 172 153    Basename 01/14/11 0547 01/13/11 0510  NA 142 140  K 4.0 4.3  CL 110 108  CO2 26 26  BUN 7 3*  CREATININE 0.73 0.65  GLUCOSE 110* 87  CALCIUM 8.7 8.8   No results found for this basename: LABPT:2,INR:2 in the last 72 hours   Physical Exam  Gen: pleasant, coloring in bed Lungs: clear Cardiac: reg rate and rhythm  Abd: soft, NT Ext: no change in exam   Motor and sensory intact  Extremity warm  + DP pulse   Swelling controlled    Assessment/Plan: 3 Days Post-Op Procedure(s) (LRB): INTRAMEDULLARY (IM) NAIL FEMORAL (Right)  40 y/o female s/p IMN R femur nonunion  1. R femur Nonunion s/p IMN   WBAT R LEx   ROM as tolerated   Dsg changes prn   Ice and elevate   PT/OT   No pillows under knee  2. Pre op Trichomoniasis   On flagyl, complete 7 day course  3. Psych   Continue home meds  4. ABL anemia   S/p 2 units PRBC' s tolerated very well  5. H/o mirgraines  6. FEN:   diet as tolerated   D/c IV 7. ACTIVITY:     WBAT, ROM as toleratd R LEx  8. DVT/PE PROPHYLAXIS:   Lovenox as inpatient and for 10-14 days post op  9. Pain: D/C pca   Encourage PO meds  10. DISPO:   PT/OT   D/c home today  F/u with ortho in 10-14-days  Mearl Latin, PA-C 01/14/2011, 8:25 AM

## 2011-01-16 ENCOUNTER — Encounter (HOSPITAL_COMMUNITY): Payer: Self-pay | Admitting: Orthopedic Surgery

## 2011-01-16 LAB — ANAEROBIC CULTURE: Gram Stain: NONE SEEN

## 2012-11-19 ENCOUNTER — Emergency Department (HOSPITAL_COMMUNITY): Payer: 59

## 2012-11-19 ENCOUNTER — Emergency Department (HOSPITAL_COMMUNITY)
Admission: EM | Admit: 2012-11-19 | Discharge: 2012-11-19 | Disposition: A | Discharge status: Home / Self Care | Payer: 59 | Attending: Emergency Medicine | Admitting: Emergency Medicine

## 2012-11-19 ENCOUNTER — Encounter (HOSPITAL_COMMUNITY): Payer: Self-pay | Admitting: *Deleted

## 2012-11-19 DIAGNOSIS — S298XXA Other specified injuries of thorax, initial encounter: Secondary | ICD-10-CM | POA: Insufficient documentation

## 2012-11-19 DIAGNOSIS — R05 Cough: Secondary | ICD-10-CM | POA: Insufficient documentation

## 2012-11-19 DIAGNOSIS — S92911A Unspecified fracture of right toe(s), initial encounter for closed fracture: Secondary | ICD-10-CM

## 2012-11-19 DIAGNOSIS — F319 Bipolar disorder, unspecified: Secondary | ICD-10-CM | POA: Insufficient documentation

## 2012-11-19 DIAGNOSIS — S060X9A Concussion with loss of consciousness of unspecified duration, initial encounter: Secondary | ICD-10-CM

## 2012-11-19 DIAGNOSIS — Z79899 Other long term (current) drug therapy: Secondary | ICD-10-CM | POA: Insufficient documentation

## 2012-11-19 DIAGNOSIS — Z872 Personal history of diseases of the skin and subcutaneous tissue: Secondary | ICD-10-CM | POA: Insufficient documentation

## 2012-11-19 DIAGNOSIS — Z8619 Personal history of other infectious and parasitic diseases: Secondary | ICD-10-CM | POA: Insufficient documentation

## 2012-11-19 DIAGNOSIS — R413 Other amnesia: Secondary | ICD-10-CM | POA: Insufficient documentation

## 2012-11-19 DIAGNOSIS — S92919A Unspecified fracture of unspecified toe(s), initial encounter for closed fracture: Secondary | ICD-10-CM | POA: Insufficient documentation

## 2012-11-19 DIAGNOSIS — Z87440 Personal history of urinary (tract) infections: Secondary | ICD-10-CM | POA: Insufficient documentation

## 2012-11-19 DIAGNOSIS — R059 Cough, unspecified: Secondary | ICD-10-CM | POA: Insufficient documentation

## 2012-11-19 DIAGNOSIS — R0789 Other chest pain: Secondary | ICD-10-CM

## 2012-11-19 DIAGNOSIS — F17200 Nicotine dependence, unspecified, uncomplicated: Secondary | ICD-10-CM | POA: Insufficient documentation

## 2012-11-19 MED ORDER — HYDROCODONE-ACETAMINOPHEN 5-325 MG PO TABS
1.0000 | ORAL_TABLET | Freq: Once | ORAL | Status: AC
  Administered 2012-11-19: 1 via ORAL
  Filled 2012-11-19: qty 1

## 2012-11-19 MED ORDER — HYDROCODONE-ACETAMINOPHEN 5-325 MG PO TABS: 1.0000 | ORAL_TABLET | ORAL | Status: DC | PRN

## 2012-11-19 NOTE — ED Notes (Signed)
Pt does not want to keep her toes "buddy taped" together, stated it made her toes hurt more, she has a metal plate in her foot from previous injury in the past.
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
## 2013-09-25 ENCOUNTER — Emergency Department (HOSPITAL_COMMUNITY): Payer: Self-pay

## 2013-09-25 ENCOUNTER — Emergency Department (HOSPITAL_COMMUNITY)
Admission: EM | Admit: 2013-09-25 | Discharge: 2013-09-25 | Disposition: A | Discharge status: Home / Self Care | Payer: Self-pay | Attending: Emergency Medicine | Admitting: Emergency Medicine

## 2013-09-25 ENCOUNTER — Encounter (HOSPITAL_COMMUNITY): Payer: Self-pay | Admitting: Emergency Medicine

## 2013-09-25 DIAGNOSIS — Z872 Personal history of diseases of the skin and subcutaneous tissue: Secondary | ICD-10-CM | POA: Insufficient documentation

## 2013-09-25 DIAGNOSIS — M5412 Radiculopathy, cervical region: Secondary | ICD-10-CM | POA: Insufficient documentation

## 2013-09-25 DIAGNOSIS — Z79899 Other long term (current) drug therapy: Secondary | ICD-10-CM | POA: Insufficient documentation

## 2013-09-25 DIAGNOSIS — IMO0002 Reserved for concepts with insufficient information to code with codable children: Secondary | ICD-10-CM | POA: Insufficient documentation

## 2013-09-25 DIAGNOSIS — M542 Cervicalgia: Secondary | ICD-10-CM | POA: Insufficient documentation

## 2013-09-25 DIAGNOSIS — Z791 Long term (current) use of non-steroidal anti-inflammatories (NSAID): Secondary | ICD-10-CM | POA: Insufficient documentation

## 2013-09-25 DIAGNOSIS — F17200 Nicotine dependence, unspecified, uncomplicated: Secondary | ICD-10-CM | POA: Insufficient documentation

## 2013-09-25 DIAGNOSIS — Z87440 Personal history of urinary (tract) infections: Secondary | ICD-10-CM | POA: Insufficient documentation

## 2013-09-25 DIAGNOSIS — F3130 Bipolar disorder, current episode depressed, mild or moderate severity, unspecified: Secondary | ICD-10-CM | POA: Insufficient documentation

## 2013-09-25 MED ORDER — PREDNISONE 50 MG PO TABS
60.0000 mg | ORAL_TABLET | Freq: Once | ORAL | Status: AC
  Administered 2013-09-25: 60 mg via ORAL
  Filled 2013-09-25 (×2): qty 1

## 2013-09-25 MED ORDER — PREDNISONE 10 MG PO TABS
ORAL_TABLET | ORAL | Status: DC
Start: 2013-09-25 — End: 2020-04-22

## 2013-09-25 MED ORDER — HYDROCODONE-ACETAMINOPHEN 5-325 MG PO TABS: 1.0000 | ORAL_TABLET | ORAL | Status: DC | PRN

## 2013-09-25 MED ORDER — CYCLOBENZAPRINE HCL 5 MG PO TABS: 5.0000 mg | ORAL_TABLET | Freq: Three times a day (TID) | ORAL | Status: DC | PRN

## 2013-09-25 MED ORDER — HYDROCODONE-ACETAMINOPHEN 5-325 MG PO TABS
1.0000 | ORAL_TABLET | Freq: Once | ORAL | Status: AC
  Administered 2013-09-25: 1 via ORAL
  Filled 2013-09-25: qty 1

## 2013-09-25 NOTE — Discharge Instructions (Signed)
Cervical Radiculopathy Cervical radiculopathy happens when a nerve in the neck is pinched or bruised by a slipped (herniated) disk or by arthritic changes in the bones of the cervical spine. This can occur due to an injury or as part of the normal aging process. Pressure on the cervical nerves can cause pain or numbness that runs from your neck all the way down into your arm and fingers. CAUSES  There are many possible causes, including:  Injury.  Muscle tightness in the neck from overuse.  Swollen, painful joints (arthritis).  Breakdown or degeneration in the bones and joints of the spine (spondylosis) due to aging.  Bone spurs that may develop near the cervical nerves. SYMPTOMS  Symptoms include pain, weakness, or numbness in the affected arm and hand. Pain can be severe or irritating. Symptoms may be worse when extending or turning the neck. DIAGNOSIS  Your caregiver will ask about your symptoms and do a physical exam. He or she may test your strength and reflexes. X-rays, CT scans, and MRI scans may be needed in cases of injury or if the symptoms do not go away after a period of time. Electromyography (EMG) or nerve conduction testing may be done to study how your nerves and muscles are working. TREATMENT  Your caregiver may recommend certain exercises to help relieve your symptoms. Cervical radiculopathy can, and often does, get better with time and treatment. If your problems continue, treatment options may include:  Wearing a soft collar for short periods of time.  Physical therapy to strengthen the neck muscles.  Medicines, such as nonsteroidal anti-inflammatory drugs (NSAIDs), oral corticosteroids, or spinal injections.  Surgery. Different types of surgery may be done depending on the cause of your problems. HOME CARE INSTRUCTIONS   Put ice on the affected area.  Put ice in a plastic bag.  Place a towel between your skin and the bag.  Leave the ice on for 15-20 minutes,  03-04 times a day or as directed by your caregiver.  If ice does not help, you can try using heat. Take a warm shower or bath, or use a hot water bottle as directed by your caregiver.  You may try a gentle neck and shoulder massage.  Use a flat pillow when you sleep.  Only take over-the-counter or prescription medicines for pain, discomfort, or fever as directed by your caregiver.  If physical therapy was prescribed, follow your caregiver's directions.  If a soft collar was prescribed, use it as directed. SEEK IMMEDIATE MEDICAL CARE IF:   Your pain gets much worse and cannot be controlled with medicines.  You have weakness or numbness in your hand, arm, face, or leg.  You have a high fever or a stiff, rigid neck.  You lose bowel or bladder control (incontinence).  You have trouble with walking, balance, or speaking. MAKE SURE YOU:   Understand these instructions.  Will watch your condition.  Will get help right away if you are not doing well or get worse. Document Released: 11/13/2000 Document Revised: 05/13/2011 Document Reviewed: 10/02/2010 [facility] Patient Information 2015 [place], [place]. This information is not intended to replace advice given to you by your health care provider. Make sure you discuss any questions you have with your health care provider.   You may wear the soft collar for comfort.  Take the other medicines as prescribed, taking your next dose of prednisone tomorrow morning.  Apply a heating pad to your neck and shoulder area 20 minutes several times daily for  pain and muscle spasm relief.  Return for your MRI as has been scheduled.  You will need to followup with a neurosurgeon if your MRI suggests you would benefit from surgery and you have been referred to one today.  You will be guided further when you have your mri on Monday.

## 2013-09-25 NOTE — ED Notes (Signed)
Neck pain and radiates to right arm and hands.  Started on Father's Day with at crick neck and neck popped.  Noticed tingling in hands and fingers on Father's.  Treated with heat pad, Maxi Freeze, heat and ice.  Medicated Aleve with mild relief.

## 2013-09-27 NOTE — ED Provider Notes (Signed)
CSN: [id number]     Arrival date & time 09/25/13  [id number] History   First MD Initiated Contact with Patient 09/25/13 1024     Chief Complaint  Patient presents with  . Neck Pain     (Consider location/radiation/quality/duration/timing/severity/associated sxs/prior Treatment) The history is provided by the patient.   Pamela SpatzStacey [name] is a 43 y.o. female presenting with pain in her neck which started one month ago.  She denies any recent injuries but reports woke fathers day weekend with a stiff neck which describes as a "crick" in the neck which has persisted, with continued difficulty turning her head to the right, and has now developed radiation of tingling and weakness into her right hand and fingers.  She denies inability to lift heavy objects with her right hand but is becoming more effortful over the past several weeks.  She has used heat, a topical muscle rub called maxi freeze, ice and aleve with only mild temporary relief of her symptoms.     Past Medical History  Diagnosis Date  . Ulcer 1997    treated and resolved  . Headache(784.0)     occassional migraines  . Low back pain   . Depression   . Bipolar 1 disorder, depressed     takes lamictal  . Shingles     has had in pass month, completed treatment week of 01/02/2011, on forehead and near eye  . UTI (lower urinary tract infection)     placed on antibiotic 01/10/11 but unsure of name   Past Surgical History  Procedure Laterality Date  . Foot fusion    . Femur fracture surgery    . Cesarean section      x2  . Tubal ligation    . Cholecystectomy    . Tonsillectomy    . Tonsilectomy, adenoidectomy, bilateral myringotomy and tubes    . Wisdom tooth extraction    . Femur im nail  01/11/2011    Procedure: INTRAMEDULLARY (IM) NAIL FEMORAL;  Surgeon: Budd PalmerMichael H Handy;  Location: [facility] OR;  Service: Orthopedics;  Laterality: Right;  Right IM Nail Exchange Right Femoral Shaft    History reviewed. No pertinent family  history. History  Substance Use Topics  . Smoking status: Current Every Day Smoker -- 28 years    Types: Cigarettes  . Smokeless tobacco: Not on file     Comment: smokes between 3 -5 cigs per day  . Alcohol Use: Yes     Comment: occassionally beer   OB History   Grav Para Term Preterm Abortions TAB SAB Ect Mult Living                 Review of Systems  Constitutional: Negative for fever and chills.  Musculoskeletal: Positive for neck pain. Negative for arthralgias, joint swelling and myalgias.  Neurological: Positive for weakness and numbness. Negative for headaches.      Allergies  Review of patient's allergies indicates no known allergies.  Home Medications   Prior to Admission medications   Medication Sig Start Date End Date Taking? Authorizing Provider  lamoTRIgine (LAMICTAL) 100 MG tablet Take 100 mg by mouth at bedtime. Takes with 25 mg to make 100 mg.   Yes Historical Provider, MD  lamoTRIgine (LAMICTAL) 25 MG tablet Take 25 mg by mouth at bedtime. Takes with the 100 mg to make 125 mg.   Yes Historical Provider, MD  Menthol, Topical Analgesic, (ZIMS MAX-FREEZE EX) Apply 1 application topically daily as needed (pain).   Yes Historical  Provider, MD  naproxen sodium (ALEVE) 220 MG tablet Take 880 mg by mouth 2 (two) times daily as needed (pain).    Yes Historical Provider, MD  traZODone (DESYREL) 100 MG tablet Take 100 mg by mouth at bedtime as needed for sleep.   Yes Historical Provider, MD  cyclobenzaprine (FLEXERIL) 5 MG tablet Take 1 tablet (5 mg total) by mouth 3 (three) times daily as needed for muscle spasms. 09/25/13   Burgess Amor, PA-C  HYDROcodone-acetaminophen (NORCO/VICODIN) 5-325 MG per tablet Take 1 tablet by mouth every 4 (four) hours as needed. 09/25/13   Burgess Amor, PA-C  predniSONE (DELTASONE) 10 MG tablet 6, 5, 4, 3, 2 then 1 tablet by mouth daily for 6 days total. 09/25/13   Burgess Amor, PA-C   BP 147/88  Pulse 112  Temp(Src) 98.2 F (36.8 C) (Oral)  Resp  18  SpO2 96% Physical Exam  Constitutional: She appears well-developed and well-nourished.  HENT:  Head: Atraumatic.  Neck: Muscular tenderness present. No spinous process tenderness present. No rigidity. Decreased range of motion present.    Cardiovascular:  Pulses equal bilaterally  Musculoskeletal: She exhibits tenderness.       Right hand: Decreased strength noted. She exhibits finger abduction and thumb/finger opposition.  Neurological: She is alert. She displays normal reflexes. No cranial nerve deficit or sensory deficit. GCS eye subscore is 4. GCS verbal subscore is 5. GCS motor subscore is 6.  Reflex Scores:      Bicep reflexes are 1+ on the right side and 2+ on the left side. Grip strength, elbow and wrist flex/ext  4/5 right, 5/5 left.    Skin: Skin is warm and dry.  Psychiatric: She has a normal mood and affect.    ED Course  Procedures (including critical care time) Labs Review Labs Reviewed - No data to display  Imaging Review Dg Cervical Spine Complete  09/25/2013   CLINICAL DATA:  Neck pain.  No known injury.  EXAM: CERVICAL SPINE  4+ VIEWS  COMPARISON:  Cervical spine CT scan 08/22/2010  FINDINGS: Chronic reversal of the normal cervical lordosis with mild stable degenerative anterior subluxation of C4 compared to C5. Slightly progressive degenerative disc disease at C5-6. No acute fracture is identified. No abnormal prevertebral soft tissue swelling. Mild brawny discussed that mild bony foraminal encroachment at C5-6 bilaterally due to uncinate spurring.  The C1-2 articulations are maintained. The dens is intact. Small cervical ribs are noted. The lung apices are clear.  IMPRESSION: Chronic reversal of the normal cervical lordosis and stable mild degenerative anterior subluxation of C4.  No acute findings.  Progressive degenerative disc disease at C5-6 with uncinate spurring and mild bilateral foraminal encroachment.   Electronically Signed   By: Loralie Champagne M.D.    On: 09/25/2013 12:15     EKG Interpretation None      MDM   Final diagnoses:  Cervical radiculopathy    Patients labs and/or radiological studies were viewed and considered during the medical decision making and disposition process. Exam concerning given weakness in right upper extremity with degenerative changes noted at C5-6.  MRI has been scheduled for op f/u.  She was placed on a prednisone taper, hydrocodone prescribed.  Referral to neurosurgery for f/u pending mri results.      Burgess Amor, PA-C 09/27/13 [phone]

## 2013-09-28 ENCOUNTER — Ambulatory Visit (HOSPITAL_COMMUNITY): Payer: Self-pay

## 2013-09-28 NOTE — ED Provider Notes (Signed)
Medical screening examination/treatment/procedure(s) were performed by non-physician practitioner and as supervising physician I was immediately available for consultation/collaboration.   EKG Interpretation None      Devoria AlbeIva Lenville Hibberd, MD, Armando GangFACEP   Ward GivensIva L Eisha Chatterjee, MD 09/28/13 1504

## 2013-10-14 ENCOUNTER — Ambulatory Visit (HOSPITAL_COMMUNITY): Payer: Self-pay

## 2013-10-26 ENCOUNTER — Ambulatory Visit (HOSPITAL_COMMUNITY): Payer: Self-pay | Attending: Emergency Medicine

## 2015-12-14 ENCOUNTER — Emergency Department (HOSPITAL_COMMUNITY)
Admission: EM | Admit: 2015-12-14 | Discharge: 2015-12-14 | Disposition: A | Discharge status: Home / Self Care | Payer: Self-pay | Attending: Dermatology | Admitting: Dermatology

## 2015-12-14 ENCOUNTER — Encounter (HOSPITAL_COMMUNITY): Payer: Self-pay | Admitting: Emergency Medicine

## 2015-12-14 DIAGNOSIS — T7411XA Adult physical abuse, confirmed, initial encounter: Secondary | ICD-10-CM | POA: Insufficient documentation

## 2015-12-14 DIAGNOSIS — Z5321 Procedure and treatment not carried out due to patient leaving prior to being seen by health care provider: Secondary | ICD-10-CM | POA: Insufficient documentation

## 2015-12-14 DIAGNOSIS — Y999 Unspecified external cause status: Secondary | ICD-10-CM | POA: Insufficient documentation

## 2015-12-14 DIAGNOSIS — F17210 Nicotine dependence, cigarettes, uncomplicated: Secondary | ICD-10-CM | POA: Insufficient documentation

## 2015-12-14 DIAGNOSIS — Y929 Unspecified place or not applicable: Secondary | ICD-10-CM | POA: Insufficient documentation

## 2015-12-14 DIAGNOSIS — Y939 Activity, unspecified: Secondary | ICD-10-CM | POA: Insufficient documentation

## 2015-12-14 NOTE — ED Notes (Signed)
Pt called to fast track but did not answer.
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
## 2015-12-14 NOTE — ED Notes (Signed)
No answer in all waiting areas 

## 2015-12-14 NOTE — ED Notes (Signed)
No answer in waiting areas x 1

## 2019-02-03 ENCOUNTER — Other Ambulatory Visit: Payer: Self-pay

## 2019-02-03 DIAGNOSIS — Z20822 Contact with and (suspected) exposure to covid-19: Secondary | ICD-10-CM

## 2019-02-06 LAB — NOVEL CORONAVIRUS, NAA: SARS-CoV-2, NAA: NOT DETECTED

## 2019-07-31 ENCOUNTER — Encounter (HOSPITAL_COMMUNITY): Payer: Self-pay

## 2019-07-31 ENCOUNTER — Emergency Department (HOSPITAL_COMMUNITY)
Admission: EM | Admit: 2019-07-31 | Discharge: 2019-08-01 | Disposition: A | Discharge status: Home / Self Care | Payer: Self-pay | Attending: Emergency Medicine | Admitting: Emergency Medicine

## 2019-07-31 ENCOUNTER — Other Ambulatory Visit: Payer: Self-pay

## 2019-07-31 ENCOUNTER — Emergency Department (HOSPITAL_COMMUNITY): Payer: Self-pay

## 2019-07-31 DIAGNOSIS — Y9389 Activity, other specified: Secondary | ICD-10-CM | POA: Insufficient documentation

## 2019-07-31 DIAGNOSIS — Y92410 Unspecified street and highway as the place of occurrence of the external cause: Secondary | ICD-10-CM | POA: Insufficient documentation

## 2019-07-31 DIAGNOSIS — S3991XA Unspecified injury of abdomen, initial encounter: Secondary | ICD-10-CM | POA: Insufficient documentation

## 2019-07-31 DIAGNOSIS — R0602 Shortness of breath: Secondary | ICD-10-CM | POA: Insufficient documentation

## 2019-07-31 DIAGNOSIS — S2222XA Fracture of body of sternum, initial encounter for closed fracture: Secondary | ICD-10-CM | POA: Insufficient documentation

## 2019-07-31 DIAGNOSIS — R1030 Lower abdominal pain, unspecified: Secondary | ICD-10-CM | POA: Insufficient documentation

## 2019-07-31 DIAGNOSIS — Y999 Unspecified external cause status: Secondary | ICD-10-CM | POA: Insufficient documentation

## 2019-07-31 DIAGNOSIS — F17210 Nicotine dependence, cigarettes, uncomplicated: Secondary | ICD-10-CM | POA: Insufficient documentation

## 2019-07-31 LAB — CBC WITH DIFFERENTIAL/PLATELET
Abs Immature Granulocytes: 0.1 10*3/uL — ABNORMAL HIGH (ref 0.00–0.07)
Basophils Absolute: 0.1 10*3/uL (ref 0.0–0.1)
Basophils Relative: 1 %
Eosinophils Absolute: 0.1 10*3/uL (ref 0.0–0.5)
Eosinophils Relative: 1 %
HCT: 41.6 % (ref 36.0–46.0)
Hemoglobin: 14.2 g/dL (ref 12.0–15.0)
Immature Granulocytes: 1 %
Lymphocytes Relative: 21 %
Lymphs Abs: 2.6 10*3/uL (ref 0.7–4.0)
MCH: 30 pg (ref 26.0–34.0)
MCHC: 34.1 g/dL (ref 30.0–36.0)
MCV: 87.9 fL (ref 80.0–100.0)
Monocytes Absolute: 0.4 10*3/uL (ref 0.1–1.0)
Monocytes Relative: 3 %
Neutro Abs: 9.3 10*3/uL — ABNORMAL HIGH (ref 1.7–7.7)
Neutrophils Relative %: 73 %
Platelets: 215 10*3/uL (ref 150–400)
RBC: 4.73 MIL/uL (ref 3.87–5.11)
RDW: 12.9 % (ref 11.5–15.5)
WBC: 12.6 10*3/uL — ABNORMAL HIGH (ref 4.0–10.5)
nRBC: 0 % (ref 0.0–0.2)

## 2019-07-31 LAB — HCG, QUANTITATIVE, PREGNANCY: hCG, Beta Chain, Quant, S: 9 m[IU]/mL — ABNORMAL HIGH (ref <5)

## 2019-07-31 LAB — COMPREHENSIVE METABOLIC PANEL
ALT: 50 U/L — ABNORMAL HIGH (ref 0–44)
AST: 68 U/L — ABNORMAL HIGH (ref 15–41)
Albumin: 3.9 g/dL (ref 3.5–5.0)
Alkaline Phosphatase: 91 U/L (ref 38–126)
Anion gap: 12 (ref 5–15)
BUN: 9 mg/dL (ref 6–20)
CO2: 23 mmol/L (ref 22–32)
Calcium: 9.5 mg/dL (ref 8.9–10.3)
Chloride: 103 mmol/L (ref 98–111)
Creatinine, Ser: 1.19 mg/dL — ABNORMAL HIGH (ref 0.44–1.00)
GFR calc Af Amer: >60 mL/min (ref >60)
GFR calc non Af Amer: 54 mL/min — ABNORMAL LOW (ref >60)
Glucose, Bld: 114 mg/dL — ABNORMAL HIGH (ref 70–99)
Potassium: 4.1 mmol/L (ref 3.5–5.1)
Sodium: 138 mmol/L (ref 135–145)
Total Bilirubin: 0.7 mg/dL (ref 0.3–1.2)
Total Protein: 6.6 g/dL (ref 6.5–8.1)

## 2019-07-31 LAB — TROPONIN I (HIGH SENSITIVITY)
Troponin I (High Sensitivity): 4 ng/L (ref <18)
Troponin I (High Sensitivity): 3 ng/L (ref <18)

## 2019-07-31 LAB — LIPASE, BLOOD: Lipase: 32 U/L (ref 11–51)

## 2019-07-31 MED ORDER — MORPHINE SULFATE (PF) 4 MG/ML IV SOLN
4.0000 mg | Freq: Once | INTRAVENOUS | Status: AC
  Administered 2019-07-31: 4 mg via INTRAVENOUS
  Filled 2019-07-31: qty 1

## 2019-07-31 MED ORDER — FENTANYL CITRATE (PF) 100 MCG/2ML IJ SOLN
100.0000 ug | Freq: Once | INTRAMUSCULAR | Status: AC
  Administered 2019-07-31: 100 ug via INTRAVENOUS
  Filled 2019-07-31: qty 2

## 2019-07-31 MED ORDER — ONDANSETRON 4 MG PO TBDP
4.0000 mg | ORAL_TABLET | Freq: Once | ORAL | Status: AC
  Administered 2019-07-31: 4 mg via ORAL
  Filled 2019-07-31: qty 1
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
## 2019-07-31 NOTE — ED Notes (Signed)
Pt remedicated for pain, CT aware labs have resulted.
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
## 2019-08-01 ENCOUNTER — Emergency Department (HOSPITAL_COMMUNITY): Payer: Self-pay

## 2019-08-01 MED ORDER — MORPHINE SULFATE 15 MG PO TABS: 7.5000 mg | ORAL_TABLET | Freq: Four times a day (QID) | ORAL | 0 refills | Status: AC | PRN

## 2019-08-01 MED ORDER — OXYCODONE-ACETAMINOPHEN 5-325 MG PO TABS
1.0000 | ORAL_TABLET | Freq: Once | ORAL | Status: AC
  Administered 2019-08-01: 1 via ORAL
  Filled 2019-08-01: qty 1

## 2019-08-01 MED ORDER — IOHEXOL 300 MG/ML  SOLN
100.0000 mL | Freq: Once | INTRAMUSCULAR | Status: AC | PRN
  Administered 2019-08-01: 100 mL via INTRAVENOUS

## 2019-08-01 MED ORDER — ACETAMINOPHEN 500 MG PO TABS: 1000.0000 mg | ORAL_TABLET | Freq: Three times a day (TID) | ORAL | 0 refills | Status: AC
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
## 2019-08-01 NOTE — ED Notes (Signed)
Pt verbalized understanding of d/c instructions, follow up, and medications. Return demonstration done with incentive spirometer. Pt transported to [facility] via [facility].

## 2020-04-22 ENCOUNTER — Other Ambulatory Visit: Payer: Self-pay

## 2020-04-22 ENCOUNTER — Encounter: Payer: Self-pay | Admitting: Emergency Medicine

## 2020-04-22 ENCOUNTER — Ambulatory Visit
Admission: EM | Admit: 2020-04-22 | Discharge: 2020-04-22 | Disposition: A | Discharge status: Home / Self Care | Payer: Self-pay | Attending: Family Medicine | Admitting: Family Medicine

## 2020-04-22 DIAGNOSIS — T148XXA Other injury of unspecified body region, initial encounter: Secondary | ICD-10-CM

## 2020-04-22 DIAGNOSIS — M545 Low back pain, unspecified: Secondary | ICD-10-CM

## 2020-04-22 MED ORDER — DEXAMETHASONE SODIUM PHOSPHATE 10 MG/ML IJ SOLN
10.0000 mg | Freq: Once | INTRAMUSCULAR | Status: AC
  Administered 2020-04-22: 10 mg via INTRAMUSCULAR

## 2020-04-22 MED ORDER — PREDNISONE 10 MG (21) PO TBPK: ORAL_TABLET | Freq: Every day | ORAL | 0 refills | Status: AC

## 2020-04-22 MED ORDER — KETOROLAC TROMETHAMINE 30 MG/ML IJ SOLN
30.0000 mg | Freq: Once | INTRAMUSCULAR | Status: AC
  Administered 2020-04-22: 30 mg via INTRAMUSCULAR

## 2020-04-22 MED ORDER — CYCLOBENZAPRINE HCL 10 MG PO TABS: 10.0000 mg | ORAL_TABLET | Freq: Two times a day (BID) | ORAL | 0 refills | Status: AC | PRN

## 2020-04-22 MED ORDER — IBUPROFEN 800 MG PO TABS: 800.0000 mg | ORAL_TABLET | Freq: Three times a day (TID) | ORAL | 0 refills | Status: AC | PRN

## 2020-04-22 NOTE — ED Provider Notes (Signed)
[facility]   [id number] 04/22/20 Arrival Time: 1221  XI:PJASN PAIN  SUBJECTIVE: History from: patient. [name] is a 50 y.o. female complains of left sided low back pain that began last week. Has been taking tylenol with little relief. Has been using ice and heat to the area with some relief. Denies a precipitating event or specific injury. States that she was walking and "her back went out." Describes the pain as constant and achy in character with intermittent sharp pains. Symptoms are made worse with activity. Denies similar symptoms in the past. Denies fever, chills, erythema, ecchymosis, effusion, weakness, numbness and tingling, saddle paresthesias, loss of bowel or bladder function.      ROS: As per HPI.  All other pertinent ROS negative.     Past Medical History:  Diagnosis Date  . Bipolar 1 disorder, depressed (HCC)    takes lamictal  . Depression   . Headache(784.0)    occassional migraines  . Low back pain   . Shingles    has had in pass month, completed treatment week of 01/02/2011, on forehead and near eye  . Ulcer 1997   treated and resolved  . UTI (lower urinary tract infection)    placed on antibiotic 01/10/11 but unsure of name   Past Surgical History:  Procedure Laterality Date  . CESAREAN SECTION     x2  . CHOLECYSTECTOMY    . FEMUR FRACTURE SURGERY    . FEMUR IM NAIL  01/11/2011   Procedure: INTRAMEDULLARY (IM) NAIL FEMORAL;  Surgeon: Budd Palmer;  Location: [facility] OR;  Service: Orthopedics;  Laterality: Right;  Right IM Nail Exchange Right Femoral Shaft   . FOOT FUSION    . TONSILECTOMY, ADENOIDECTOMY, BILATERAL MYRINGOTOMY AND TUBES    . TONSILLECTOMY    . TUBAL LIGATION    . WISDOM TOOTH EXTRACTION     No Known Allergies No current facility-administered medications on file prior to encounter.   No current outpatient medications on file prior to encounter.   Social History   Socioeconomic History  . Marital status:  Married    Spouse name: Not on file  . Number of children: Not on file  . Years of education: Not on file  . Highest education level: Not on file  Occupational History  . Not on file  Tobacco Use  . Smoking status: Current Every Day Smoker    Packs/day: 1.00    Years: 28.00    Pack years: 28.00    Types: Cigarettes  . Smokeless tobacco: Never Used  . Tobacco comment: smokes between 3 -5 cigs per day  Substance and Sexual Activity  . Alcohol use: Yes    Comment: occassionally beer  . Drug use: Yes    Types: Marijuana    Comment: more than a year ago  . Sexual activity: Yes  Other Topics Concern  . Not on file  Social History Narrative  . Not on file   Social Determinants of Health   Financial Resource Strain: Not on file  Food Insecurity: Not on file  Transportation Needs: Not on file  Physical Activity: Not on file  Stress: Not on file  Social Connections: Not on file  Intimate Partner Violence: Not on file   Family History  Problem Relation Age of Onset  . Diabetes Mother   . Cancer Mother   . Diabetes Father   . Heart failure Father     OBJECTIVE:  Vitals:   04/22/20 1305  BP: Marland Kitchen)  141/91  Pulse: 77  Resp: 18  Temp: (!) 97.5 F (36.4 C)  TempSrc: Oral  SpO2: 96%    General appearance: ALERT; in no acute distress.  Head: NCAT Lungs: Normal respiratory effort CV: pulses 2+ bilaterally. Cap refill < 2 seconds Musculoskeletal:  Inspection: Skin warm, dry, clear and intact No erythema, effusion noted  Palpation: Left low back tender to palpation and in spasm ROM: Limited ROM active and passive to back with bending, twisting, and changing positions  Skin: warm and dry Neurologic: Ambulates without difficulty; Sensation intact about the upper/ lower extremities Psychological: alert and cooperative; normal mood and affect  DIAGNOSTIC STUDIES:  No results found.   ASSESSMENT & PLAN:  1. Acute left-sided low back pain without sciatica   2. Muscle  strain     Meds ordered this encounter  Medications  . ketorolac (TORADOL) 30 MG/ML injection 30 mg  . dexamethasone (DECADRON) injection 10 mg  . predniSONE (STERAPRED UNI-PAK 21 TAB) 10 MG (21) TBPK tablet    Sig: Take by mouth daily for 6 days. Take 6 tablets on day 1, 5 tablets on day 2, 4 tablets on day 3, 3 tablets on day 4, 2 tablets on day 5, 1 tablet on day 6    Dispense:  21 tablet    Refill:  0    Order Specific Question:   Supervising Provider    Answer:   Merrilee Jansky [id number]  . cyclobenzaprine (FLEXERIL) 10 MG tablet    Sig: Take 1 tablet (10 mg total) by mouth 2 (two) times daily as needed for muscle spasms.    Dispense:  20 tablet    Refill:  0    Order Specific Question:   Supervising Provider    Answer:   Merrilee Jansky [id number]  . ibuprofen (ADVIL) 800 MG tablet    Sig: Take 1 tablet (800 mg total) by mouth every 8 (eight) hours as needed for moderate pain.    Dispense:  21 tablet    Refill:  0    Order Specific Question:   Supervising Provider    Answer:   Merrilee Jansky [[id number]]   Toradol 30 mg IM in office today Decadron 10mg  IM in office today Steriod taper prescribed Flexeril prescribed Ibuprofen prescribed Continue conservative management of rest, ice, and gentle stretches Take ibuprofen as needed for pain relief (may cause abdominal discomfort, ulcers, and GI bleeds avoid taking with other NSAIDs) Take cyclobenzaprine at nighttime for symptomatic relief. Avoid driving or operating heavy machinery while using medication. Follow up with PCP if symptoms persist Return or go to the ER if you have any new or worsening symptoms (fever, chills, chest pain, abdominal pain, changes in bowel or bladder habits, pain radiating into lower legs)  Reviewed expectations re: course of current medical issues. Questions answered. Outlined signs and symptoms indicating need for more acute intervention. Patient verbalized understanding. After Visit Summary  given.       , NP 04/22/20 1331

## 2020-04-22 NOTE — Discharge Instructions (Signed)
I have sent in ibuprofen for you to take one tablet every 8 hours as needed for pain and inflammation.  I have sent in flexeril for you to take twice a day as needed for muscle spasms. This medication can make you sleepy. Do not drive or operate heavy machinery with this medication.  I have sent in a prednisone taper for you to take for 6 days. 6 tablets on day one, 5 tablets on day two, 4 tablets on day three, 3 tablets on day four, 2 tablets on day five, and 1 tablet on day six.  Follow up with this office or with primary care if symptoms are persisting.  Follow up in the ER for high fever, trouble swallowing, trouble breathing, other concerning symptoms.

## 2020-04-22 NOTE — ED Triage Notes (Signed)
Lower left back pain, states she felt something pop last Sunday.  Knee buckled and she tried to keep herself from falling when back popped.

## 2020-08-30 ENCOUNTER — Emergency Department (HOSPITAL_COMMUNITY): Payer: PRIVATE HEALTH INSURANCE

## 2020-08-30 ENCOUNTER — Encounter (HOSPITAL_COMMUNITY): Payer: Self-pay

## 2020-08-30 ENCOUNTER — Other Ambulatory Visit: Payer: Self-pay

## 2020-08-30 ENCOUNTER — Emergency Department (HOSPITAL_COMMUNITY)
Admission: EM | Admit: 2020-08-30 | Discharge: 2020-08-30 | Disposition: A | Discharge status: Home / Self Care | Payer: PRIVATE HEALTH INSURANCE | Attending: Emergency Medicine | Admitting: Emergency Medicine

## 2020-08-30 DIAGNOSIS — F17210 Nicotine dependence, cigarettes, uncomplicated: Secondary | ICD-10-CM | POA: Insufficient documentation

## 2020-08-30 DIAGNOSIS — Z20822 Contact with and (suspected) exposure to covid-19: Secondary | ICD-10-CM | POA: Insufficient documentation

## 2020-08-30 DIAGNOSIS — R5383 Other fatigue: Secondary | ICD-10-CM | POA: Insufficient documentation

## 2020-08-30 DIAGNOSIS — R55 Syncope and collapse: Secondary | ICD-10-CM | POA: Insufficient documentation

## 2020-08-30 DIAGNOSIS — N399 Disorder of urinary system, unspecified: Secondary | ICD-10-CM

## 2020-08-30 DIAGNOSIS — R531 Weakness: Secondary | ICD-10-CM | POA: Diagnosis not present

## 2020-08-30 LAB — BASIC METABOLIC PANEL
Anion gap: 6 (ref 5–15)
BUN: 10 mg/dL (ref 6–20)
CO2: 23 mmol/L (ref 22–32)
Calcium: 8.6 mg/dL — ABNORMAL LOW (ref 8.9–10.3)
Chloride: 109 mmol/L (ref 98–111)
Creatinine, Ser: 0.78 mg/dL (ref 0.44–1.00)
GFR, Estimated: >60 mL/min (ref >60)
Glucose, Bld: 84 mg/dL (ref 70–99)
Potassium: 3.4 mmol/L — ABNORMAL LOW (ref 3.5–5.1)
Sodium: 138 mmol/L (ref 135–145)

## 2020-08-30 LAB — URINALYSIS, ROUTINE W REFLEX MICROSCOPIC
Bilirubin Urine: NEGATIVE
Glucose, UA: NEGATIVE mg/dL
Hgb urine dipstick: NEGATIVE
Ketones, ur: NEGATIVE mg/dL
Leukocytes,Ua: NEGATIVE
Nitrite: POSITIVE — AB
Protein, ur: NEGATIVE mg/dL
Specific Gravity, Urine: 1.004 — ABNORMAL LOW (ref 1.005–1.030)
pH: 7 (ref 5.0–8.0)

## 2020-08-30 LAB — CBC WITH DIFFERENTIAL/PLATELET
Abs Immature Granulocytes: 0.07 10*3/uL (ref 0.00–0.07)
Basophils Absolute: 0.1 10*3/uL (ref 0.0–0.1)
Basophils Relative: 1 %
Eosinophils Absolute: 0.2 10*3/uL (ref 0.0–0.5)
Eosinophils Relative: 1 %
HCT: 45.9 % (ref 36.0–46.0)
Hemoglobin: 15.9 g/dL — ABNORMAL HIGH (ref 12.0–15.0)
Immature Granulocytes: 0 %
Lymphocytes Relative: 21 %
Lymphs Abs: 3.7 10*3/uL (ref 0.7–4.0)
MCH: 31 pg (ref 26.0–34.0)
MCHC: 34.6 g/dL (ref 30.0–36.0)
MCV: 89.5 fL (ref 80.0–100.0)
Monocytes Absolute: 0.7 10*3/uL (ref 0.1–1.0)
Monocytes Relative: 4 %
Neutro Abs: 12.6 10*3/uL — ABNORMAL HIGH (ref 1.7–7.7)
Neutrophils Relative %: 73 %
Platelets: 213 10*3/uL (ref 150–400)
RBC: 5.13 MIL/uL — ABNORMAL HIGH (ref 3.87–5.11)
RDW: 12.6 % (ref 11.5–15.5)
WBC: 17.4 10*3/uL — ABNORMAL HIGH (ref 4.0–10.5)
nRBC: 0 % (ref 0.0–0.2)

## 2020-08-30 LAB — RESP PANEL BY RT-PCR (FLU A&B, COVID) ARPGX2
Influenza A by PCR: NEGATIVE
Influenza B by PCR: NEGATIVE
SARS Coronavirus 2 by RT PCR: NEGATIVE

## 2020-08-30 LAB — PREGNANCY, URINE: Preg Test, Ur: NEGATIVE

## 2020-08-30 MED ORDER — CEPHALEXIN 500 MG PO CAPS: 500.0000 mg | ORAL_CAPSULE | Freq: Four times a day (QID) | ORAL | 0 refills | Status: AC

## 2020-08-30 MED ORDER — SODIUM CHLORIDE 0.9 % IV SOLN
1.0000 g | Freq: Once | INTRAVENOUS | Status: AC
  Administered 2020-08-30: 1 g via INTRAVENOUS
  Filled 2020-08-30: qty 10

## 2020-08-30 MED ORDER — SODIUM CHLORIDE 0.9 % IV BOLUS
1000.0000 mL | Freq: Once | INTRAVENOUS | Status: AC
Start: 2020-08-30 — End: 2020-08-30
  Administered 2020-08-30: 1000 mL via INTRAVENOUS

## 2020-08-30 MED ORDER — SODIUM CHLORIDE 0.9 % IV BOLUS
1000.0000 mL | Freq: Once | INTRAVENOUS | Status: AC
  Administered 2020-08-30: 1000 mL via INTRAVENOUS

## 2020-08-30 NOTE — Discharge Instructions (Signed)
It is important that you drink plenty of fluids since the next few days.  Primarily water.  Also, your test today showed that you likely have a urinary tract infection.  Please take the antibiotic as directed until its finished.  Follow-up with your primary care provider for recheck.  Return to the emergency department for any new or worsening symptoms.

## 2020-08-30 NOTE — ED Provider Notes (Signed)
[facility] EMERGENCY DEPARTMENT Provider Note   CSN: [id number] Arrival date & time: 08/30/20  1003      Magdalen SpatzStacey Soyars-Randleman is a 50 y.o. female who presents to the Emergency Department complaining of generalized weakness and fatigue x1 day.  She states that she gave plasma yesterday and after leaving the facility, she became dizzy, diaphoretic, and had a brief episode of syncope.  She went to a friend's house and attempted to drink fluids and ate some M&Ms.  She vomited x2 shortly after.  States that she then felt somewhat improved, but continues to have generalized weakness.  Reports a brief episode of confusion this morning while taking a shower, used soap to wash her hair and shampoo on her body.  She denies headache, continued dizziness, visual changes, chest pain, shortness of breath, fever or chills, or or recent illness.  No known COVID exposures.  She has never given plasma before.   History Chief Complaint  Patient presents with   Weakness    Magdalen SpatzStacey Soyars-Randleman is a 50 y.o. female.   Weakness Associated symptoms: no abdominal pain, no arthralgias, no chest pain, no cough, no dizziness, no dysuria, no fever, no headaches, no myalgias, no nausea, no shortness of breath and no vomiting       Past Medical History:  Diagnosis Date   Bipolar 1 disorder, depressed (HCC)    takes lamictal   Depression    Headache(784.0)    occassional migraines   Low back pain    Shingles    has had in pass month, completed treatment week of 01/02/2011, on forehead and near eye   Ulcer 1997   treated and resolved   UTI (lower urinary tract infection)    placed on antibiotic 01/10/11 but unsure of name    Patient Active Problem List   Diagnosis Date Noted   Acute blood loss anemia 01/13/2011   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   Trichimoniasis 01/11/2011   Depression 01/11/2011   Bipolar 1 disorder, depressed (HCC) 01/11/2011   Ulcer 01/11/2011   Low back  pain 01/11/2011   Headache(784.0) 01/11/2011    Past Surgical History:  Procedure Laterality Date   CESAREAN SECTION     x2   CHOLECYSTECTOMY     FEMUR FRACTURE SURGERY     FEMUR IM NAIL  01/11/2011   Procedure: INTRAMEDULLARY (IM) NAIL FEMORAL;  Surgeon: Budd PalmerMichael H Handy;  Location: [facility] OR;  Service: Orthopedics;  Laterality: Right;  Right IM Nail Exchange Right Femoral Shaft    FOOT FUSION     TONSILECTOMY, ADENOIDECTOMY, BILATERAL MYRINGOTOMY AND TUBES     TONSILLECTOMY     TUBAL LIGATION     WISDOM TOOTH EXTRACTION       OB History     Gravida  6   Para      Term      Preterm      AB  2   Living  4      SAB  2   IAB      Ectopic      Multiple      Live Births              Family History  Problem Relation Age of Onset   Diabetes Mother    Cancer Mother    Diabetes Father    Heart failure Father     Social History   Tobacco Use   Smoking status: Every Day    Packs/day: 1.00  Years: 28.00    Pack years: 28.00    Types: Cigarettes   Smokeless tobacco: Never   Tobacco comments:    smokes between 3 -5 cigs per day  Substance Use Topics   Alcohol use: Yes    Comment: occassionally beer   Drug use: Yes    Types: Marijuana    Comment: more than a year ago    Home Medications Prior to Admission medications   Medication Sig Start Date End Date Taking? Authorizing Provider  cyclobenzaprine (FLEXERIL) 10 MG tablet Take 1 tablet (10 mg total) by mouth 2 (two) times daily as needed for muscle spasms. Patient not taking: Reported on 08/30/2020 04/22/20   Moshe Cipro, NP  ibuprofen (ADVIL) 800 MG tablet Take 1 tablet (800 mg total) by mouth every 8 (eight) hours as needed for moderate pain. Patient not taking: Reported on 08/30/2020 04/22/20   Moshe Cipro, NP    Allergies    Patient has no known allergies.  Review of Systems   Review of Systems  Constitutional:  Positive for fatigue. Negative for activity change, appetite  change, chills and fever.  HENT:  Negative for congestion and trouble swallowing.   Eyes:  Negative for visual disturbance.  Respiratory:  Negative for cough and shortness of breath.   Cardiovascular:  Negative for chest pain.  Gastrointestinal:  Negative for abdominal pain, blood in stool, nausea and vomiting.  Genitourinary:  Negative for dysuria, flank pain and hematuria.  Musculoskeletal:  Negative for arthralgias, back pain, myalgias, neck pain and neck stiffness.  Skin:  Negative for rash.  Neurological:  Positive for syncope and weakness. Negative for dizziness, speech difficulty, numbness and headaches.  Hematological:  Does not bruise/bleed easily.  Psychiatric/Behavioral:  Positive for confusion.    Physical Exam Updated Vital Signs BP 95/74   Pulse 66   Temp 98.1 F (36.7 C) (Oral)   Resp 19   Ht 5\' 1"  (1.549 m)   Wt 57.6 kg   SpO2 96%   BMI 24.00 kg/m   Physical Exam Vitals and nursing note reviewed.  Constitutional:      Appearance: Normal appearance. She is not ill-appearing or toxic-appearing.  HENT:     Mouth/Throat:     Mouth: Mucous membranes are moist.  Eyes:     Extraocular Movements: Extraocular movements intact.     Conjunctiva/sclera: Conjunctivae normal.     Pupils: Pupils are equal, round, and reactive to light.  Cardiovascular:     Rate and Rhythm: Normal rate and regular rhythm.     Pulses: Normal pulses.  Pulmonary:     Effort: Pulmonary effort is normal.  Abdominal:     Palpations: Abdomen is soft.     Tenderness: There is no abdominal tenderness.  Musculoskeletal:     Cervical back: Normal range of motion. No tenderness.     Right lower leg: No edema.     Left lower leg: No edema.  Skin:    General: Skin is warm.     Capillary Refill: Capillary refill takes less than 2 seconds.     Findings: No erythema or rash.  Neurological:     General: No focal deficit present.     Mental Status: She is alert.     GCS: GCS eye subscore is 4.  GCS verbal subscore is 5. GCS motor subscore is 6.     Sensory: Sensation is intact. No sensory deficit.     Motor: Motor function is intact. No weakness.  Comments: CN II-XII intact.  Speech clear.  No facial droop.  No pronator drift.     ED Results / Procedures / Treatments   Labs (all labs ordered are listed, but only abnormal results are displayed) Labs Reviewed  URINALYSIS, ROUTINE W REFLEX MICROSCOPIC - Abnormal; Notable for the following components:      Result Value   Specific Gravity, Urine 1.004 (*)    Nitrite POSITIVE (*)    Bacteria, UA RARE (*)    All other components within normal limits  CBC WITH DIFFERENTIAL/PLATELET - Abnormal; Notable for the following components:   WBC 17.4 (*)    RBC 5.13 (*)    Hemoglobin 15.9 (*)    Neutro Abs 12.6 (*)    All other components within normal limits  BASIC METABOLIC PANEL - Abnormal; Notable for the following components:   Potassium 3.4 (*)    Calcium 8.6 (*)    All other components within normal limits  RESP PANEL BY RT-PCR (FLU A&B, COVID) ARPGX2  URINE CULTURE  PREGNANCY, URINE    EKG EKG Interpretation  Date/Time:  Wednesday August 30 2020 10:20:52 EDT Ventricular Rate:  68 PR Interval:  125 QRS Duration: 88 QT Interval:  421 QTC Calculation: 448 R Axis:   19 Text Interpretation: Sinus rhythm RSR' in V1 or V2, right VCD or RVH Confirmed by Alvester Chou [phone]) on 08/30/2020 12:21:57 PM  Radiology DG Chest Portable 1 View  Result Date: 08/30/2020 CLINICAL DATA:  Evaluate for pneumonia.  Weakness. EXAM: PORTABLE CHEST 1 VIEW COMPARISON:  07/31/2019 FINDINGS: Heart size and mediastinal contours appear normal. No pleural effusion or edema identified. No airspace consolidation identified. Chronic interstitial markings and hyperinflation compatible with emphysema. IMPRESSION: 1. No acute cardiopulmonary abnormalities. 2. Emphysema. Electronically Signed   By: Signa Kell M.D.   On: 08/30/2020 13:00     Procedures Procedures   Medications Ordered in ED Medications  sodium chloride 0.9 % bolus 1,000 mL (1,000 mLs Intravenous New Bag/Given 08/30/20 1048)    ED Course  I have reviewed the triage vital signs and the nursing notes.  Pertinent labs & imaging results that were available during my care of the patient were reviewed by me and considered in my medical decision making (see chart for details).    MDM Rules/Calculators/A&P                          Patient here for evaluation of generalized weakness and a brief episode of syncope yesterday after giving plasma.  She had 2 episodes of vomiting yesterday.  On exam, patient well-appearing.  BP soft.  Pressure is 93/71 and pt reported too weak to check orthostatic's.  Nontoxic-appearing.  No focal neurodeficits.  Possible dehydration secondary to her recent plasma donation.  Will check  labs and urinalysis.  Labs interpreted by me, patient has leukocytosis, source unclear.  no significant electrolyte derangement.  Kidney function unremarkable.  COVID test negative.  CXR w/o acute findings.    Urinalysis showed positive nitrite.  Patient denies any dysuria symptoms.  Urine culture pending.  This is likely source of her leukocytosis.  Will give Rocephin here.  Patient will likely be discharged home with prescription for antibiotic.  Discussed adequate oral hydration and she is agreeable to outpatient follow-up.  Return precautions were discussed.  On recheck, pt ambulated to the restroom w/o difficulty, no dizziness or ataxia.  Reports feeling better and ready for d/c home.    Final  Clinical Impression(s) / ED Diagnoses Final diagnoses:  None    Rx / DC Orders ED Discharge Orders     None        Pauline Aus, PA-C 08/30/20 1446    Terald Sleeper, MD 08/30/20 [phone]

## 2020-08-30 NOTE — ED Notes (Signed)
Pt in bed, pt denies pain, denies dizziness, states that she is ready to go home, family at bedside.

## 2020-08-30 NOTE — ED Notes (Signed)
Pt in bed, pt states that she is feeling better, denies dizziness at this time.

## 2020-08-30 NOTE — ED Notes (Signed)
Pt.got too weak to do standing ortho.B/P 93/71

## 2020-08-30 NOTE — ED Triage Notes (Signed)
Pt presents to ED with complaints of generalized weakness since yesterday. Pt states she gave plasma yesterday and has been extremely weak since then.

## 2020-09-02 LAB — URINE CULTURE: Culture: >=100000 — AB

## 2020-09-03 ENCOUNTER — Telehealth: Payer: Self-pay | Admitting: Emergency Medicine

## 2020-09-03 NOTE — Telephone Encounter (Signed)
Post ED Visit - Positive Culture Follow-up  Culture report reviewed by antimicrobial stewardship pharmacist: [facility] []  , Pharm.D. []  Enzo Bi, Pharm.D., BCPS AQ-ID []  , Pharm.D., BCPS []  Celedonio Miyamoto, Pharm.D., BCPS []  [place], Garvin Fila.D., BCPS, AAHIVP []  , Pharm.D., BCPS, AAHIVP []  Georgina Pillion, PharmD, BCPS []  , PharmD, BCPS []  [place], PharmD, BCPS [x]  [address], PharmD []  , PharmD, BCPS []  Estella Husk, PharmD  Pharmacy Team []  Lysle Pearl, PharmD []  , PharmD []  Phillips Climes, PharmD []  , Rph []  Agapito Games) , PharmD []  Delmar Landau, PharmD []  , PharmD []  Mervyn Gay, PharmD []  , PharmD []  Vinnie Level, PharmD []  [facility], PharmD []  , PharmD []  Len Childs, PharmD   Positive urine culture Treated with Cephalexin, organism sensitive to the same and no further patient follow-up is required at this time.  [name] 09/03/2020, 6:11 PM
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
## 2023-03-02 IMAGING — DX DG CHEST 1V PORT
1 series · 1 of 1 positions shown · non-contrast
Comparison: 07/31/2019

CLINICAL DATA: Evaluate for pneumonia.  Weakness.

EXAM:
PORTABLE CHEST 1 VIEW

[chest ap]
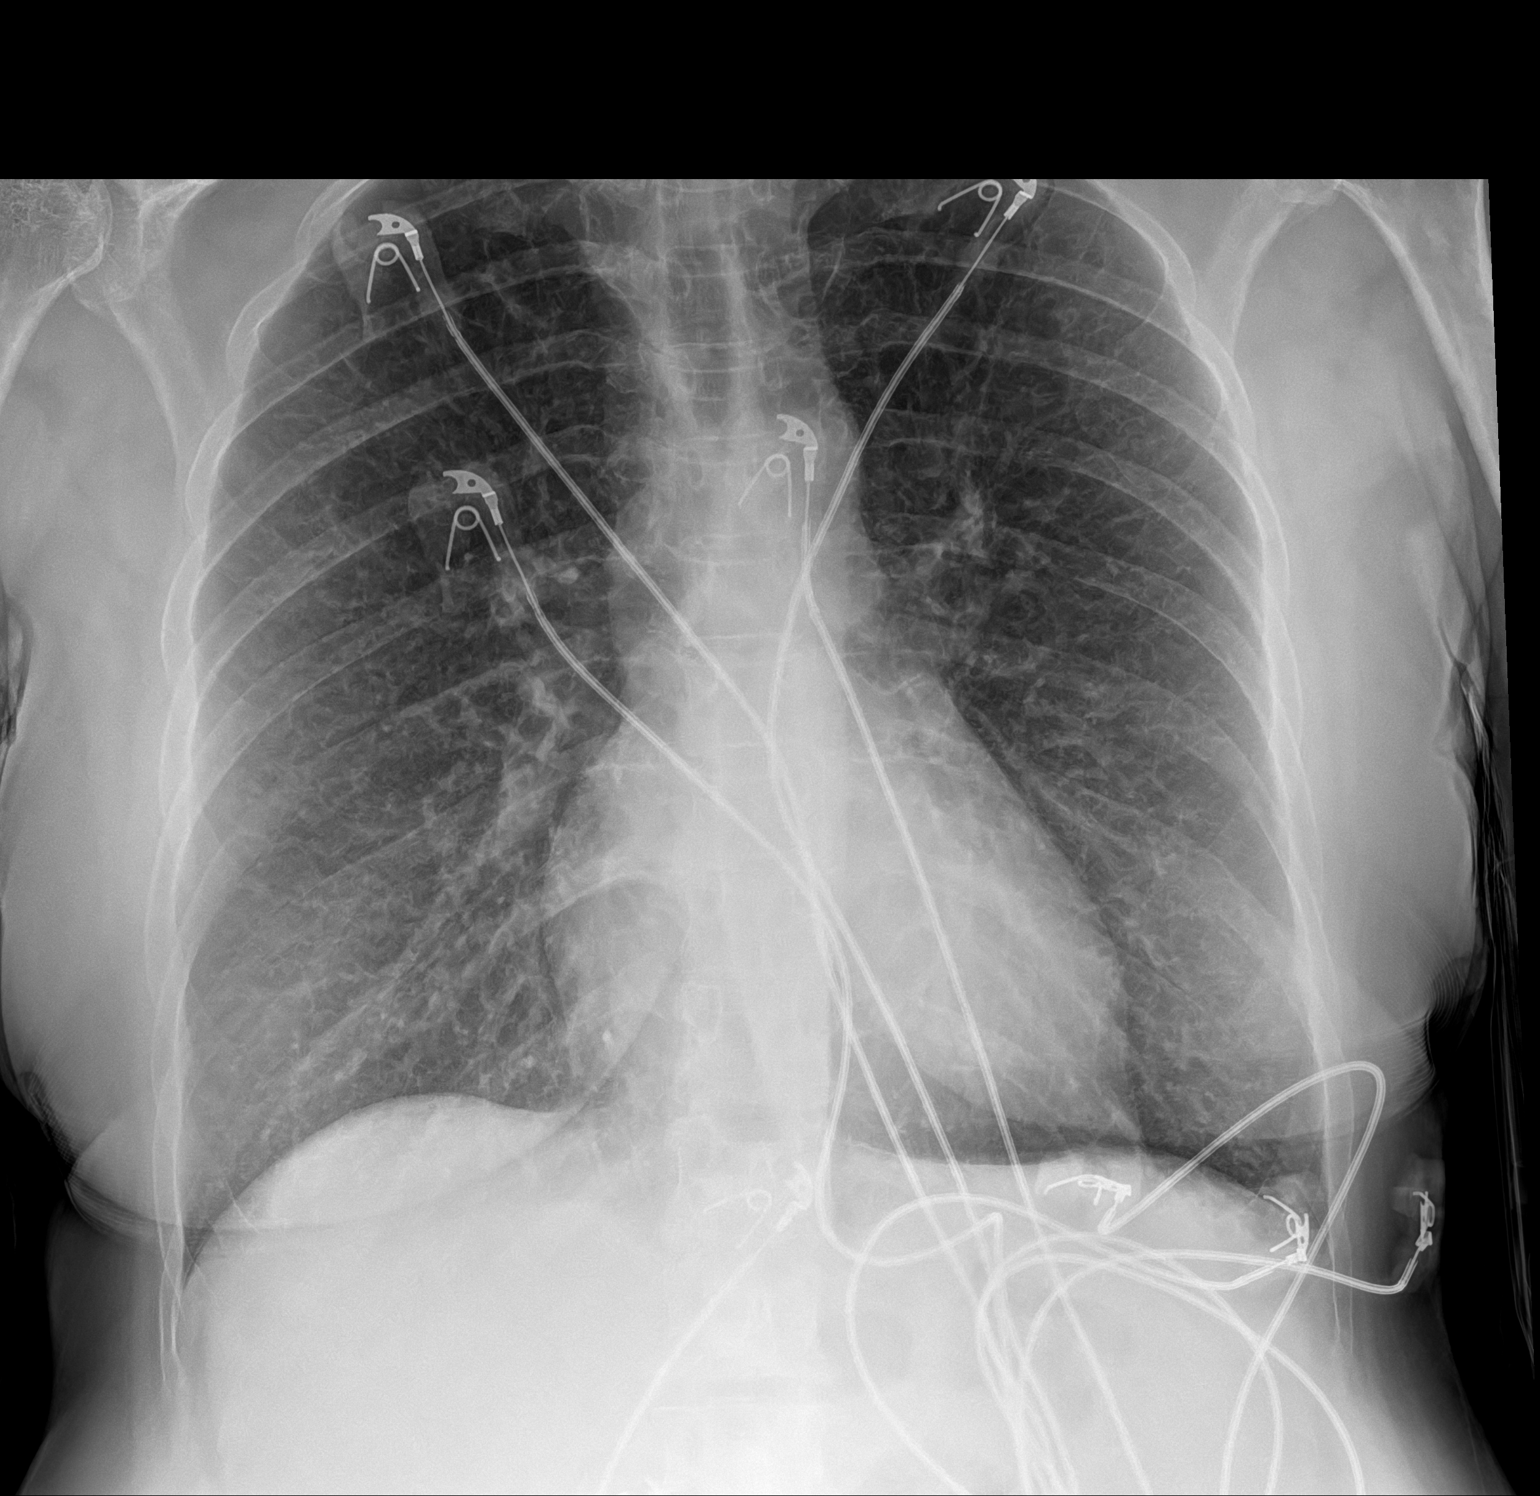

[1 of 1 positions shown; findings below may reference images not displayed]

FINDINGS: Heart size and mediastinal contours appear normal. No pleural
effusion or edema identified. No airspace consolidation identified.
Chronic interstitial markings and hyperinflation compatible with
emphysema.
IMPRESSION: 1. No acute cardiopulmonary abnormalities.
2. Emphysema.
